# Patient Record
Sex: Male | Born: 1960 | Hispanic: No | Marital: Married | Smoking: Former smoker
Health system: Southern US, Community
[De-identification: ages and names within clinical notes are randomized; demographics above are authoritative.]

## PROBLEM LIST (undated history)

## (undated) DIAGNOSIS — T7840XA Allergy, unspecified, initial encounter: Secondary | ICD-10-CM

## (undated) DIAGNOSIS — Z860100 Personal history of colon polyps, unspecified: Secondary | ICD-10-CM

## (undated) DIAGNOSIS — I82409 Acute embolism and thrombosis of unspecified deep veins of unspecified lower extremity: Secondary | ICD-10-CM

## (undated) DIAGNOSIS — R7303 Prediabetes: Secondary | ICD-10-CM

## (undated) DIAGNOSIS — C159 Malignant neoplasm of esophagus, unspecified: Secondary | ICD-10-CM

## (undated) DIAGNOSIS — D649 Anemia, unspecified: Secondary | ICD-10-CM

## (undated) DIAGNOSIS — Z8719 Personal history of other diseases of the digestive system: Secondary | ICD-10-CM

## (undated) DIAGNOSIS — D179 Benign lipomatous neoplasm, unspecified: Secondary | ICD-10-CM

## (undated) DIAGNOSIS — M1990 Unspecified osteoarthritis, unspecified site: Secondary | ICD-10-CM

## (undated) HISTORY — PX: PORTA CATH REMOVAL: CATH118286

## (undated) HISTORY — DX: Anemia, unspecified: D64.9

## (undated) HISTORY — PX: UPPER GASTROINTESTINAL ENDOSCOPY: SHX188

## (undated) HISTORY — DX: Allergy, unspecified, initial encounter: T78.40XA

## (undated) HISTORY — DX: Acute embolism and thrombosis of unspecified deep veins of unspecified lower extremity: I82.409

## (undated) HISTORY — DX: Personal history of colon polyps, unspecified: Z86.0100

## (undated) HISTORY — DX: Benign lipomatous neoplasm, unspecified: D17.9

## (undated) HISTORY — PX: COLONOSCOPY: SHX174

## (undated) HISTORY — PX: TONSILLECTOMY: SUR1361

## (undated) HISTORY — DX: Personal history of other diseases of the digestive system: Z87.19

## (undated) HISTORY — PX: POLYPECTOMY: SHX149

## (undated) HISTORY — DX: Personal history of colonic polyps: Z86.010

---

## 2006-09-09 ENCOUNTER — Encounter: Payer: Self-pay | Admitting: Internal Medicine

## 2009-06-19 ENCOUNTER — Encounter: Payer: Self-pay | Admitting: Internal Medicine

## 2009-06-19 LAB — CONVERTED CEMR LAB
Albumin: 4.7 g/dL
Alkaline Phosphatase: 63 units/L
BUN: 13 mg/dL
Creatinine, Ser: 0.9 mg/dL
HDL: 67 mg/dL
Sodium: 137 meq/L
TSH: 0.46 microintl units/mL
Total Bilirubin: 0.7 mg/dL
Total Protein: 7.1 g/dL
Triglyceride fasting, serum: 39 mg/dL

## 2010-05-18 ENCOUNTER — Encounter: Payer: Self-pay | Admitting: Internal Medicine

## 2010-06-25 ENCOUNTER — Ambulatory Visit: Payer: Self-pay | Admitting: Internal Medicine

## 2010-06-25 ENCOUNTER — Encounter: Payer: Self-pay | Admitting: Internal Medicine

## 2010-06-25 DIAGNOSIS — R1012 Left upper quadrant pain: Secondary | ICD-10-CM | POA: Insufficient documentation

## 2010-06-25 DIAGNOSIS — I959 Hypotension, unspecified: Secondary | ICD-10-CM

## 2010-06-25 DIAGNOSIS — J45909 Unspecified asthma, uncomplicated: Secondary | ICD-10-CM | POA: Insufficient documentation

## 2010-06-25 DIAGNOSIS — Z8719 Personal history of other diseases of the digestive system: Secondary | ICD-10-CM

## 2010-06-25 DIAGNOSIS — R079 Chest pain, unspecified: Secondary | ICD-10-CM | POA: Insufficient documentation

## 2010-06-25 DIAGNOSIS — Z860100 Personal history of colon polyps, unspecified: Secondary | ICD-10-CM | POA: Insufficient documentation

## 2010-06-25 DIAGNOSIS — J309 Allergic rhinitis, unspecified: Secondary | ICD-10-CM | POA: Insufficient documentation

## 2010-06-25 HISTORY — DX: Chest pain, unspecified: R07.9

## 2010-06-25 HISTORY — DX: Left upper quadrant pain: R10.12

## 2010-06-26 LAB — CONVERTED CEMR LAB
AST: 22 units/L (ref 0–37)
Alkaline Phosphatase: 80 units/L (ref 39–117)
BUN: 13 mg/dL (ref 6–23)
Basophils Absolute: 0 10*3/uL (ref 0.0–0.1)
Bilirubin, Direct: 0.1 mg/dL (ref 0.0–0.3)
Calcium: 9 mg/dL (ref 8.4–10.5)
Creatinine, Ser: 0.8 mg/dL (ref 0.4–1.5)
GFR calc non Af Amer: 103.6 mL/min (ref >60.00)
Glucose, Bld: 97 mg/dL (ref 70–99)
Ketones, ur: NEGATIVE mg/dL
Leukocytes, UA: NEGATIVE
Lymphocytes Relative: 20.1 % (ref 12.0–46.0)
Lymphs Abs: 1.1 10*3/uL (ref 0.7–4.0)
Monocytes Relative: 10.2 % (ref 3.0–12.0)
Neutrophils Relative %: 67.7 % (ref 43.0–77.0)
Nitrite: NEGATIVE
Platelets: 226 10*3/uL (ref 150.0–400.0)
RDW: 14 % (ref 11.5–14.6)
Specific Gravity, Urine: 1.02 (ref 1.000–1.030)
TSH: 0.72 microintl units/mL (ref 0.35–5.50)
Total Bilirubin: 0.8 mg/dL (ref 0.3–1.2)
Total Protein, Urine: NEGATIVE mg/dL
WBC: 5.6 10*3/uL (ref 4.5–10.5)
pH: 6.5 (ref 5.0–8.0)

## 2010-07-06 ENCOUNTER — Telehealth (INDEPENDENT_AMBULATORY_CARE_PROVIDER_SITE_OTHER): Payer: Self-pay | Admitting: *Deleted

## 2010-07-08 HISTORY — PX: ESOPHAGOGASTRECTOMY: SHX1528

## 2010-07-10 ENCOUNTER — Encounter: Payer: Self-pay | Admitting: Internal Medicine

## 2010-07-23 ENCOUNTER — Ambulatory Visit
Admission: RE | Admit: 2010-07-23 | Discharge: 2010-07-23 | Payer: Self-pay | Source: Home / Self Care | Attending: Internal Medicine | Admitting: Internal Medicine

## 2010-07-23 ENCOUNTER — Encounter (INDEPENDENT_AMBULATORY_CARE_PROVIDER_SITE_OTHER): Payer: Self-pay | Admitting: *Deleted

## 2010-07-23 DIAGNOSIS — R141 Gas pain: Secondary | ICD-10-CM

## 2010-07-23 DIAGNOSIS — R143 Flatulence: Secondary | ICD-10-CM

## 2010-07-23 DIAGNOSIS — K219 Gastro-esophageal reflux disease without esophagitis: Secondary | ICD-10-CM | POA: Insufficient documentation

## 2010-07-23 DIAGNOSIS — R142 Eructation: Secondary | ICD-10-CM

## 2010-07-23 HISTORY — DX: Gas pain: R14.1

## 2010-08-09 NOTE — Procedures (Signed)
Summary: Ledon Snare MD  Colon/James Cora Collum MD   Imported By: Lester Palmer 07/13/2010 10:39:07  _____________________________________________________________________  External Attachment:    Type:   Image     Comment:   External Document

## 2010-08-09 NOTE — Letter (Addendum)
Summary: Primary Care Consult Scheduled Letter  [facility]  [address] [place], [place] [address]   Phone: [phone]  Fax: [phone]      07/23/2010 MRN: [id number]  [facility] [address] [place], [place]  [address]    Dear Mr. ITALIANO,      We have scheduled an appointment for you. At the recommendation of Dr.Lescber, we have scheduled you a consult with Dr.JacobsCorinda [facility]) on Feb 17,2012 Fri at 3:00 pm .Their address is [address] . The office phone number is [phone].If this appointment day and time is not convenient for you, please feel free to call the office of the doctor you are being referred to at the number listed above and reschedule the appointment.    It is important for you to keep your scheduled appointments. We are here to make sure you are given good patient care. If you have questions or you have made changes to your appointment, please notify us at  [phone]        , ask for    debra          .   Thank you,  Patient Care Coordinator [facility]

## 2010-08-09 NOTE — Progress Notes (Signed)
  Phone Note Other Incoming   Request: Send information Summary of Call: Records received from [facility] at [facility]. 12 pages forwarded to Dr. Felicity Coyer for review.

## 2010-08-09 NOTE — Letter (Signed)
Summary: Deboraha Sprang at [facility]   Imported By: Lester [place] 07/13/2010 10:37:42  _____________________________________________________________________  External Attachment:    Type:   Image     Comment:   External Document

## 2010-08-09 NOTE — Assessment & Plan Note (Signed)
Summary: 4 WK FU  STC   Vital Signs:  Patient profile:   50 year old male Height:      74 inches (187.96 cm) Weight:      214.6 pounds (97.55 kg) O2 Sat:      95 % on Room air Temp:     97.8 degrees F (36.56 degrees C) oral Pulse rate:   691 / minute BP sitting:   102 / 68  (left arm) Cuff size:   large  Vitals Entered By: Orlan Leavens RMA (July 23, 2010 9:05 AM)  O2 Flow:  Room air CC: 4 week follow-up Is Patient Diabetic? No Pain Assessment Patient in pain? no        Primary Care Provider:  Newt Lukes MD  CC:  4 week follow-up.  History of Present Illness:  f/u abd pain - located LUQ onset Nov 2011- has improved with PPI once daily use since 06/2010 prev radiated to L chest and substernum region now pain improved, only mild stab if any pain - but persisitng gas/belching no sticking with swallow or regurg no n/v or change in bowels - no lower abd pain, no back or shoulder blade pain symptoms  no BRBPR or melena no fever or recent travel - continue fatigue since onset of symptoms +weight loss 5# in 4 weeks due to gas and sour belching no cough, SOB or HA - no vision changes or swelling  Preventive Screening-Counseling & Management  Alcohol-Tobacco     Alcohol drinks/day: 0     Alcohol Counseling: not indicated; patient does not drink     Smoking Status: quit     Packs/Day: 1.0     Year Quit: 1999  Caffeine-Diet-Exercise     Does Patient Exercise: yes     Depression Counseling: not indicated; screening negative for depression  Clinical Review Panels:  Lipid Management   Cholesterol:  151 (06/19/2009)   LDL (bad choesterol):  75 (06/19/2009)   HDL (good cholesterol):  67 (06/19/2009)   Triglycerides:  39 (06/19/2009)  CBC   WBC:  5.6 (06/25/2010)   RBC:  4.32 (06/25/2010)   Hgb:  13.3 (06/25/2010)   Hct:  38.6 (06/25/2010)   Platelets:  226.0 (06/25/2010)   MCV  89.2 (06/25/2010)   MCHC  34.6 (06/25/2010)   RDW  14.0 (06/25/2010)  PMN:  67.7 (06/25/2010)   Lymphs:  20.1 (06/25/2010)   Monos:  10.2 (06/25/2010)   Eosinophils:  1.4 (06/25/2010)   Basophil:  0.6 (06/25/2010)  Complete Metabolic Panel   Glucose:  97 (06/25/2010)   Sodium:  139 (06/25/2010)   Potassium:  4.1 (06/25/2010)   Chloride:  104 (06/25/2010)   CO2:  27 (06/25/2010)   BUN:  13 (06/25/2010)   Creatinine:  0.8 (06/25/2010)   Albumin:  3.9 (06/25/2010)   Total Protein:  6.6 (06/25/2010)   Calcium:  9.0 (06/25/2010)   Total Bili:  0.8 (06/25/2010)   Alk Phos:  80 (06/25/2010)   SGPT (ALT):  15 (06/25/2010)   SGOT (AST):  22 (06/25/2010)   Current Medications (verified): 1)  Proventil Hfa 108 (90 Base) Mcg/act Aers (Albuterol Sulfate) .... Use As Needed 2)  Prilosec Otc 20 Mg Tbec (Omeprazole Magnesium) .... 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    Physical Exam  General:  thin, nontoxic - alert, well-developed, well-nourished, and cooperative to examination.   wife at side Lungs:  normal respiratory effort, no intercostal retractions or use of accessory muscles; normal breath sounds bilaterally - no crackles and no wheezes.    Heart:  normal rate, regular rhythm, no murmur, and no rub. BLE without edema. Abdomen:  soft, non-tender, normal bowel sounds, no distention; no masses and no  appreciable hepatomegaly or splenomegaly.     Impression & Recommendations:  Problem # 1:  GERD (ICD-530.81)  LUQ pain improved - cont ppi - erx done prior 06/2010 labs reviewed - normal requests eval by gi due to cont belching and gas - done  His updated medication list for this problem includes:    Omeprazole 20 Mg Cpdr (Omeprazole) .[name]... 1 by mouth once daily  Orders: Gastroenterology Referral (GI)  Labs Reviewed: Hgb: 13.3 (06/25/2010)   Hct: 38.6 (06/25/2010)  Problem # 2:  BELCHING (ICD-787.3) try gas x and refer to gi for eval as requested Orders: Gastroenterology Referral (GI)  Complete Medication List: 1)  Proventil Hfa 108 (90 Base) Mcg/act Aers (Albuterol sulfate) .... Use as needed 2)  Omeprazole 20 Mg Cpdr (Omeprazole) .[name].. 1 by mouth once daily  Patient Instructions: 1)  it was good to see you today. 2)  continue omeprazole 20mg  daily for now - your prescription has been electronically submitted to your pharmacy. Please take as directed. Contact our office if you believe you're having problems with the medication(s).  3)  also use GasX for bloating/belching/gas symptoms - take as directed on box - ok to use with prilosec as needed  4)  we'll make referral to [facility] for evaluation. Our office will contact you regarding this appointment once made.  5)  Please schedule a follow-up appointment in 4-6 months to review, call sooner if problems.  Prescriptions: OMEPRAZOLE 20 MG CPDR (OMEPRAZOLE) 1 by mouth once daily  #30 x 6   Entered and Authorized by:   Newt Lukes MD   Signed by:   Newt Lukes MD on 07/23/2010   Method used:   Electronically to        [facility] # [phone]* (retail)       [address]       [place], [place]  [address]       Ph: [phone]       Fax: [phone]   RxID:   [id number]    Orders Added: 1)  Est. Patient Level IV [47425] 2)  Gastroenterology Referral [GI]

## 2010-08-09 NOTE — Letter (Signed)
SummaryDeboraha Sprang at [facility] at [facility]   Imported By: Lester [place] 07/13/2010 10:40:05  _____________________________________________________________________  External Attachment:    Type:   Image     Comment:   External Document

## 2010-08-09 NOTE — Assessment & Plan Note (Signed)
Summary: NEW/BCBS/#/CD   Vital Signs:  Alfred Delgado:   50 year old male Height:      74 inches (187.96 cm) Weight:      219.0 pounds (99.55 kg) BMI:     28.22 O2 Sat:      96 % on Room air Temp:     98.6 degrees F (37.00 degrees C) oral Pulse rate:   87 / minute BP sitting:   98 / 68  (left arm) Cuff size:   large  Vitals Entered By: Alfred Delgado RMA (June 25, 2010 9:34 AM)  O2 Flow:  Room air CC: [name] Is Alfred Diabetic? No Pain Assessment Alfred in pain? no        Primary Care Alfred Delgado:  Newt Lukes MD  CC:  [name].  History of Present Illness: new pt to me and our practce, here to est care  c/o abd pain - located LUQ onset 4-6 weeks ago radiates to L chest and substernum region intermittent symptoms - occurs almost every day but various intensity - usually mild but 3 episodes intense stabbing pain pain improved with skipping meals - worse with any food no sticking with swallow or regurg no n/v or change in bowels - no lower abd pain, no back or shoulder blade pain symptoms  no BRBPR or melena no fever or recent travel - started PPI otc 4 days ago - ?mild improved pain symptoms  c/o fatigue since onset of symptoms - also "gas" and sour belching no cough, SOB or HA - no vision changes or swelling  Preventive Screening-Counseling & Management  Alcohol-Tobacco     Alcohol drinks/day: 0     Alcohol Counseling: not indicated; Alfred does not drink     Smoking Status: quit     Packs/Day: 1.0     Year Quit: 1999  Caffeine-Diet-Exercise     Does Alfred Exercise: yes     Depression Counseling: not indicated; screening negative for depression  Comments: quit smoking and drinking 1999  Clinical Review Panels:  Prevention   Last Colonoscopy:  Location:  [facility].   Results: Normal.  Pt states previous colon dx with diverticulosis, 2 polyp remove. Next colon due March 2013 (07/08/2006)   Current Medications  (verified): 1)  Proventil Hfa 108 (90 Base) Mcg/act Aers (Albuterol Sulfate) .... Use As Needed 2)  Prilosec Otc 20 Mg Tbec (Omeprazole Magnesium) .... 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  The Alfred denies fever, weight loss, vision loss, hoarseness, syncope, dyspnea on exertion, peripheral edema, headaches, melena, hematochezia, severe indigestion/heartburn, hematuria, incontinence, genital sores, muscle weakness, suspicious skin lesions, transient blindness, difficulty walking, depression, abnormal bleeding, enlarged lymph nodes, and angioedema.         otherwise, see HPI above. I have reviewed all other systems and they were negative.   Physical Exam  General:  thin, nontoxic - alert, well-developed,  well-nourished, and cooperative to examination.   wife at side Head:  Normocephalic and atraumatic without obvious abnormalities. No apparent alopecia or balding. Eyes:  vision grossly intact; pupils equal, round and reactive to light.  conjunctiva and lids normal.    Ears:  normal pinnae bilaterally, without erythema, swelling, or tenderness to palpation. TMs clear, without effusion, or cerumen impaction. Hearing grossly normal bilaterally  Mouth:  teeth and gums in good repair; mucous membranes moist, without lesions or ulcers. oropharynx clear without exudate, no erythema.  Neck:   prominant cricoid- supple, full ROM, no masses, no thyromegaly; no thyroid nodules or tenderness. no JVD or carotid bruits.   Chest Wall:  No deformities, masses, tenderness or gynecomastia noted. Lungs:  normal respiratory effort, no intercostal retractions or use of accessory muscles; normal breath sounds bilaterally - no crackles and no wheezes.    Heart:  normal rate, regular rhythm, no murmur, and no rub. BLE without edema. normal DP pulses and normal cap refill in all 4 extremities    Abdomen:  soft, non-tender, normal bowel sounds, no distention; no masses and no appreciable hepatomegaly or splenomegaly.   Msk:  No deformity or scoliosis noted of thoracic or lumbar spine.   Neurologic:  alert & oriented X3 and cranial nerves II-XII symetrically intact.  strength normal in all extremities, sensation intact to light touch, and gait normal. speech fluent without dysarthria or aphasia; follows commands with good comprehension.  Skin:  subcut "cysts" along anterior chest and abd wall - nontender and not inflammed - chronic per pt - otherwise,  no rashes, vesicles, ulcers, or erythema. No nodules or irregularity to palpation.  Psych:  Oriented X3, memory intact for recent and remote, normally interactive, good eye contact, not anxious appearing, not depressed appearing, and not agitated.      Impression & Recommendations:  Problem # 1:  LUQ PAIN (ICD-789.02) look for metabolic or GI abn on labs - EKG for radiation to chest -- no ischemia or other abn  labs - cont PPI, consider inc dose if no lab abn - also ?need for CT but doubt related to diverticulitis (no pain on exam, not reporducible) also suggested gas x or probiotics for bloating and gas symptoms if labs ok Orders: TLB-BMP (Basic Metabolic Panel-BMET) (80048-METABOL) TLB-CBC Platelet - w/Differential (85025-CBCD) TLB-Hepatic/Liver Function Pnl (80076-HEPATIC) TLB-TSH (Thyroid Stimulating Hormone) (84443-TSH) TLB-Udip w/ Micro  (81001-URINE) EKG w/ Interpretation (93000)  Problem # 2:  CHEST PAIN (ICD-786.50) ?esophageal or other GI abn - see LUQ pain above - EKG today - no cardiac abn - O2 normal, nontoxic on exam Orders: TLB-BMP (Basic Metabolic Panel-BMET) (80048-METABOL) TLB-CBC Platelet - w/Differential (85025-CBCD) TLB-Hepatic/Liver Function Pnl (80076-HEPATIC) TLB-TSH (Thyroid Stimulating Hormone) (84443-TSH) TLB-Udip w/ Micro (81001-URINE) EKG w/ Interpretation (93000)  Problem # 3:  UNSPECIFIED HYPOTENSION (ICD-458.9) labs as above - pt not dizzy or presyncopal -  encouraged by mouth intake - hydration - send for prior records as pt "always low BP but not usually this low" Orders: TLB-BMP (Basic Metabolic Panel-BMET) (80048-METABOL) TLB-CBC Platelet - w/Differential (85025-CBCD) TLB-Hepatic/Liver Function Pnl (80076-HEPATIC) TLB-TSH (Thyroid Stimulating Hormone) (84443-TSH) TLB-Udip w/ Micro (81001-URINE) EKG w/ Interpretation (93000)  Complete Medication List: 1)  Proventil Hfa 108 (90 Base) Mcg/act Aers (Albuterol sulfate) .... Use as needed 2)  Prilosec Otc 20 Mg Tbec (Omeprazole magnesium) .... Take 1 by mouth once daily  Alfred Instructions: 1)  it was good to see you today. 2)  exam and EKG today look good - no heart abnormality  seen 3)  test(s) ordered today - your results will be posted on the phone tree for review in 48-72 hours from the time of test completion; call [phone] and enter your 9 digit MRN (listed above on this page, just below your name); if any changes need to be made or there are abnormal results, you will be contacted directly.  4)  continue Prilosec 20mg  daily for now - if dose needs to be changed, you will be notified after review of your labs 5)  also use GasX for bloating/belching/gas symptoms - take as directed on box - ok to use with prilosec as needed  6)  will send for records from [place] to review 7)  Please schedule a follow-up appointment in 4 weeks to  continue review of gas and pain, call sooner if problems.  8)  Drink as much fluid as you can tolerate for the next few days.   Orders Added: 1)  TLB-BMP (Basic Metabolic Panel-BMET) [80048-METABOL] 2)  TLB-CBC Platelet - w/Differential [85025-CBCD] 3)  TLB-Hepatic/Liver Function Pnl [80076-HEPATIC] 4)  TLB-TSH (Thyroid Stimulating Hormone) [84443-TSH] 5)  TLB-Udip w/ Micro [81001-URINE] 6)  EKG w/ Interpretation [93000] 7)  [name] Level IV [84132]     Colonoscopy  Procedure date:  07/08/2006  Findings:      Location:  [facility].   Results: Normal.  Pt states previous colon dx with diverticulosis, 2 polyp remove. Next colon due March 2013

## 2010-08-24 ENCOUNTER — Encounter: Payer: Self-pay | Admitting: Gastroenterology

## 2010-08-24 ENCOUNTER — Encounter (INDEPENDENT_AMBULATORY_CARE_PROVIDER_SITE_OTHER): Payer: Self-pay | Admitting: *Deleted

## 2010-08-24 ENCOUNTER — Ambulatory Visit (INDEPENDENT_AMBULATORY_CARE_PROVIDER_SITE_OTHER): Payer: Managed Care, Other (non HMO) | Admitting: Gastroenterology

## 2010-08-24 DIAGNOSIS — R12 Heartburn: Secondary | ICD-10-CM

## 2010-08-24 DIAGNOSIS — R1310 Dysphagia, unspecified: Secondary | ICD-10-CM

## 2010-08-29 ENCOUNTER — Ambulatory Visit (HOSPITAL_COMMUNITY)
Admission: RE | Admit: 2010-08-29 | Discharge: 2010-08-29 | Disposition: A | Discharge status: Home / Self Care | Payer: Managed Care, Other (non HMO) | Source: Ambulatory Visit | Attending: Gastroenterology | Admitting: Gastroenterology

## 2010-08-29 ENCOUNTER — Encounter: Payer: Managed Care, Other (non HMO) | Admitting: Gastroenterology

## 2010-08-29 ENCOUNTER — Other Ambulatory Visit: Payer: Self-pay | Admitting: Gastroenterology

## 2010-08-29 DIAGNOSIS — R634 Abnormal weight loss: Secondary | ICD-10-CM | POA: Insufficient documentation

## 2010-08-29 DIAGNOSIS — C160 Malignant neoplasm of cardia: Secondary | ICD-10-CM

## 2010-08-29 DIAGNOSIS — R1310 Dysphagia, unspecified: Secondary | ICD-10-CM | POA: Insufficient documentation

## 2010-08-29 DIAGNOSIS — Z800 Family history of malignant neoplasm of digestive organs: Secondary | ICD-10-CM | POA: Insufficient documentation

## 2010-08-29 NOTE — Letter (Signed)
Summary: EGD Instructions  [facility]  [address] [place], [place] [address]   Phone: [phone]  Fax: [phone]       SASCHA BAUGHER    1961-04-03    MRN: [id number]       Procedure Day /Date:08/29/10 WED     Arrival Time: 730 am     Procedure Time:830 am     Location of Procedure:                     [facility] ( Outpatient Registration)    PREPARATION FOR ENDOSCOPY   On 08/29/10  THE DAY OF THE PROCEDURE:  1.   No solid foods, milk or milk products are allowed after midnight the night before your procedure.  2.   Do not drink anything colored red or purple.  Avoid juices with pulp.  No orange juice.  3.  You may drink clear liquids until 430 am , which is 4 hours before your procedure.                                                                                                CLEAR LIQUIDS INCLUDE: Water Jello Ice Popsicles Tea (sugar ok, no milk/cream) Powdered fruit flavored drinks Coffee (sugar ok, no milk/cream) Gatorade Juice: apple, white grape, white cranberry  Lemonade Clear bullion, consomm, broth Carbonated beverages (any kind) Strained chicken noodle soup Hard Candy   MEDICATION INSTRUCTIONS  Unless otherwise instructed, you should take regular prescription medications with a small sip of water as early as possible the morning of your procedure.             OTHER INSTRUCTIONS  You will need a responsible adult at least 49 years of age to accompany you and drive you home.   This person must remain in the waiting room during your procedure.  Wear loose fitting clothing that is easily removed.  Leave jewelry and other valuables at home.  However, you may wish to bring a book to read or an iPod/MP3 player to listen to music as you wait for your procedure to start.  Remove all body piercing jewelry and leave at home.  Total time from sign-in until discharge is approximately 2-3 hours.  You should go home  directly after your procedure and rest.  You can resume normal activities the day after your procedure.  The day of your procedure you should not:   Drive   Make legal decisions   Operate machinery   Drink alcohol   Return to work  You will receive specific instructions about eating, activities and medications before you leave.    The above instructions have been reviewed and explained to me by   _______________________    I fully understand and can verbalize these instructions _____________________________ Date _________

## 2010-08-29 NOTE — Assessment & Plan Note (Signed)
   History of Present Illness Visit Type: Initial Consult Primary GI MD: Rob Bunting MD Primary Provider: Newt Lukes MD Requesting Provider: Rene Paci, MD Chief Complaint: GERD History of Present Illness:        very pleasant 50 year old man who ws having LUQ pains, can have foamy.  Has been having mildly progressive solid food dyphagia for the past few months.  Not improved with ppi.  has been losing weight, relatively unintentialy.    his father had esophageal cancer  The luq pain is crampy.    sore lower jaw.  he started otc prilosec,            Current Medications (verified): 1)  Proventil Hfa 108 (90 Base) Mcg/act Aers (Albuterol Sulfate) .... Use As Needed 2)  Omeprazole 20 Mg Cpdr (Omeprazole) .[name].. 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  All other review of systems was otherwise negative.   Vital Signs:  Patient profile:   50 year old male Height:      74 inches Weight:      212.13 pounds BMI:     27.33 Pulse rate:   76 / minute Pulse rhythm:   regular BP sitting:   106 / 66  (left arm) Cuff size:   regular  Vitals Entered By: June McMurray CMA Duncan Dull) (August 24, 2010 3:00 PM)  Physical Exam  Additional Exam:  Constitutional: generally well appearing Psychiatric: alert and oriented  times 3 Eyes: extraocular movements intact Mouth: oropharynx moist, no lesions Neck: supple, no lymphadenopathy Cardiovascular: heart regular rate and rythm Lungs: CTA bilaterally Abdomen: soft, non-tender, non-distended, no obvious ascites, no peritoneal signs, normal bowel sounds Extremities: no lower extremity edema bilaterally Skin: no lesions on visible extremities    Impression & Recommendations:  Problem # 1:   left upper quadrant discomfort, GERD related. mildly progressive solid food dysphagia  I am concerned about the possibility of neoplasm. he has lost some weight , rather unintentionally. he has slightly progressive dysphasia. We will proceed with EGD next week. He will stay on proton pump inhibitor once daily for now.  Patient Instructions: 1)  You will be scheduled to have an upper endoscopy nex week at [facility]. 2)  GERD handout given. 3)  A copy of this information will be sent to Dr. Nolberto Hanlon. 4)  The medication list was reviewed and reconciled.  All changed / newly prescribed medications were explained.  A complete medication list was provided to the patient / caregiver.  Appended Document: Orders Update/EGD    Clinical Lists Changes  Orders: Added new Test order of ZEGD (ZEGD) - Signed

## 2010-08-30 ENCOUNTER — Other Ambulatory Visit: Payer: Self-pay | Admitting: Gastroenterology

## 2010-08-30 DIAGNOSIS — Z8501 Personal history of malignant neoplasm of esophagus: Secondary | ICD-10-CM | POA: Insufficient documentation

## 2010-08-30 DIAGNOSIS — C169 Malignant neoplasm of stomach, unspecified: Secondary | ICD-10-CM

## 2010-08-30 DIAGNOSIS — C159 Malignant neoplasm of esophagus, unspecified: Secondary | ICD-10-CM

## 2010-08-30 HISTORY — DX: Malignant neoplasm of esophagus, unspecified: C15.9

## 2010-09-03 ENCOUNTER — Other Ambulatory Visit: Payer: Managed Care, Other (non HMO)

## 2010-09-03 ENCOUNTER — Ambulatory Visit (INDEPENDENT_AMBULATORY_CARE_PROVIDER_SITE_OTHER)
Admission: RE | Admit: 2010-09-03 | Discharge: 2010-09-03 | Disposition: A | Discharge status: Home / Self Care | Payer: Managed Care, Other (non HMO) | Source: Ambulatory Visit | Attending: Gastroenterology | Admitting: Gastroenterology

## 2010-09-03 DIAGNOSIS — C169 Malignant neoplasm of stomach, unspecified: Secondary | ICD-10-CM

## 2010-09-03 MED ORDER — IOHEXOL 300 MG/ML  SOLN
100.0000 mL | Freq: Once | INTRAMUSCULAR | Status: AC | PRN
  Administered 2010-09-03: 100 mL via INTRAVENOUS

## 2010-09-04 NOTE — Procedures (Signed)
Summary: Upper Endoscopy  Patient: [name] Note: All result statuses are Final unless otherwise noted.  Tests: (1) Upper Endoscopy (EGD)   EGD Upper Endoscopy       DONE     [facility]     [address] [place], [place]  [address]           ENDOSCOPY PROCEDURE REPORT           PATIENT:  Alfred Delgado, Alfred Delgado  MR#:  [id number]     BIRTHDATE:  04/26/1961, 49 yrs. old  GENDER:  male     ENDOSCOPIST:  Rachael Fee, MD     Referred by:  Rene Paci, M.D.     PROCEDURE DATE:  08/29/2010     PROCEDURE:  EGD with biopsy, 43239     ASA CLASS:  Class II     INDICATIONS:  dysphagia, weight loss, father with esophageal     cancer     MEDICATIONS:  Fentanyl 75 mcg IV, Versed 4 mg IV     TOPICAL ANESTHETIC:  Cetacaine Spray           DESCRIPTION OF PROCEDURE:   After the risks benefits and     alternatives of the procedure were thoroughly explained, informed     consent was obtained.  The Pentax Gastroscope [id number] endoscope     was introduced through the mouth and advanced to the second     portion of the duodenum, without limitations.  The instrument was     slowly withdrawn as the mucosa was fully examined.     <<PROCEDUREIMAGES>>     There was a friable, fungating, nearly circumferential mass at GE     junction. The proximal edge of this malignant appearing mass was     at 38cm from incisors and distal edge was at 42cm. GE junction     landmark was obliterated but probably at about 41cm. Several     biopsies were taken (see image1 and image3).  Otherwise the     examination was normal (see image4 and image6).    Retroflexed     views revealed no abnormalities.    The scope was then withdrawn     from the patient and the procedure completed.     COMPLICATIONS:  None           ENDOSCOPIC IMPRESSION:     1) Clearly malignant mass at GE junction (from 42cm to 38cm,     nearly circumferential. GE junction at 41cm). This was biopsied     2) Otherwise normal  examination           RECOMMENDATIONS:     My office will arrange staging workup (CT scan and bloodwork) as     well as referral to Dr. Dewayne Shorter, Dr. Mancel Bale.  If CT     shows no clear metastatic disease, then will proceed with EUS.           ______________________________     Rachael Fee, MD           n.     eSIGNED:   Rachael Fee at 08/29/2010 09:01 AM           Daphine Deutscher, [id number]  Note: An exclamation mark (!) indicates a result that was not dispersed into the flowsheet. Document Creation Date: 08/29/2010 9:02 AM _______________________________________________________________________  (1) Order result status: Final Collection or observation date-time: 08/29/2010 08:53 Requested date-time:  Receipt date-time:  Reported date-time:  Referring Physician:   Ordering Physician: Rob Bunting [phone]) Specimen Source:  Source: Launa Grill Order Number: [id number] Lab site:   Appended Document: Upper Endoscopy patty,  he needs referral to Dr. Edwyna Shell, Dr. Mancel Bale for newly diagnosed GE junction cancer...staging in the works.  He also needs CT scan chest, abd, pelvis with IV and oral contrast.   Appended Document: Orders Update/Dr Edwyna Shell, Dr Truett Perna, CT PT AWARE AND WILL PICK UP CONTRAST AT FRONT DESK   Clinical Lists Changes  Problems: Added new problem of NEOPLASM, MALIGNANT, ESOPHAGUS (ICD-150.9) Orders: Added new Test order of [facility]) - Signed Added new Test order of Misc. Referral (Misc. Ref) - Signed Added new Referral order of CT Chest/Abdomen w IV and Oral Contrast (CT CH/ABD w IV/Oral ) - Signed Added new Test order of T-CT Pelvis w/contrast (98119) - Signed

## 2010-09-04 NOTE — Procedures (Signed)
Summary: ENDO prep/[facility]  ENDO prep/[facility]   Imported By: Lester Polk 08/28/2010 10:09:55  _____________________________________________________________________  External Attachment:    Type:   Image     Comment:   External Document

## 2010-09-05 ENCOUNTER — Encounter: Payer: Self-pay | Admitting: Internal Medicine

## 2010-09-05 ENCOUNTER — Telehealth (INDEPENDENT_AMBULATORY_CARE_PROVIDER_SITE_OTHER): Payer: Self-pay | Admitting: *Deleted

## 2010-09-05 ENCOUNTER — Other Ambulatory Visit: Payer: Self-pay | Admitting: Thoracic Surgery

## 2010-09-05 ENCOUNTER — Encounter (INDEPENDENT_AMBULATORY_CARE_PROVIDER_SITE_OTHER): Payer: Self-pay | Admitting: *Deleted

## 2010-09-05 ENCOUNTER — Encounter (INDEPENDENT_AMBULATORY_CARE_PROVIDER_SITE_OTHER): Payer: Managed Care, Other (non HMO) | Admitting: Thoracic Surgery

## 2010-09-05 DIAGNOSIS — C160 Malignant neoplasm of cardia: Secondary | ICD-10-CM

## 2010-09-05 DIAGNOSIS — C159 Malignant neoplasm of esophagus, unspecified: Secondary | ICD-10-CM

## 2010-09-06 NOTE — Letter (Signed)
September 05, 2010  Rachael Fee, MD [address] [place], [place] [address]  Re:  Alfred Delgado, Alfred Delgado                 DOB:  1961-01-13  Dear Jesusita Oka;  This 50 year old patient has an interesting family history that his father had esophageal cancer and he came in with complaint of left upper quadrant pain and mild dysphagia.  He described his swallowing as kind of occasionally foamy.  He has had weight loss and crampy left upper quadrant pain.  He underwent a CT scan that showed a distal esophageal mass and an endoscopy by Dr. Christella Hartigan revealed esophageal cancer from 38- 42 cm and a biopsy at 41 cm was positive for cancer.  He has had no carcinoma arising in high-grade glandular dysplasia.  He underwent a CT. He was scheduled for ultrasound evaluation.  There were 2 nodes seen on the CT scan that were 12 mm in size and several small nodes in the celiac axis.  He has had no melena or hematemesis, but has had some mild GERD and mild dysphagia.  He is referred here.  He is scheduled to see Dr. Truett Perna in 2 days.  PAST MEDICAL HISTORY:  He takes Prilosec 20 mg a day, albuterol, and Claritin.  He has got allergies and asthma.  FAMILY HISTORY:  As mentioned, his father had thyroid cancer.  His mother had breast cancer and thyroid disease.  His sister with thyroid disease.  SOCIAL HISTORY:  He is married.  He has no children.  He works as a Curator for [employer].  He quit smoking in 1999.  He does not drink alcohol on a regular basis.  REVIEW OF SYSTEMS:  VITAL SIGNS:  He is 210 pounds.  He is 6 feet 2 inches. CARDIAC:  No angina or atrial fibrillation. PULMONARY:  No hemoptysis. GI:  See history of present illness. GU:  No kidney disease, dysuria, or frequent urination. VASCULAR:  No claudication, DVT, or TIAs. NEUROLOGICAL:  No dizziness, headaches, blackouts, or seizures. MUSCULOSKELETAL:  Arthritis. PSYCHIATRIC:  No depression or nervousness. EYES/ENT:  No changes in eyesight or  hearing. HEMATOLOGICAL:  No problems with bleeding, clotting disorders, or anemia.  PHYSICAL EXAMINATION:  Vital Signs:  His blood pressure is 106/66, pulse 84, respirations 18, and sats are 97%.  Weight is 210 pounds, he is 6 feet 2 inches. Head, Eyes, Ears, Nose, and Throat:  Unremarkable.  Neck:  Supple without thyromegaly.  There is no supraclavicular or axillary adenopathy.  Chest:  Clear to auscultation and percussion.  Heart: Regular sinus rhythm.  No murmur.  Abdomen:  Soft.  There is no hepatosplenomegaly.  Extremities:  Pulses are 2+.  There is no clubbing or edema.  Neurological:  He is oriented x3.  Sensory and motor intact. Cranial nerves intact.  Plan to get a PET scan and a brain scan on him, and then we will discuss his case with Dr. Truett Perna.  I suspect he has at least stage III esophageal cancer.  I would recommend preoperative chemotherapy and radiation followed by surgery if he is not a stage IV.  I have discussed this in great detail with he and his wife, and I will plan to see him back again in 4 weeks.  Ines Bloomer, M.D. Electronically Signed  DPB/MEDQ  D:  09/05/2010  T:  09/06/2010  Job:  [id number]  cc:   Vikki Ports A. Felicity Coyer, MD

## 2010-09-07 ENCOUNTER — Encounter (HOSPITAL_BASED_OUTPATIENT_CLINIC_OR_DEPARTMENT_OTHER): Payer: Managed Care, Other (non HMO) | Admitting: Oncology

## 2010-09-07 DIAGNOSIS — C160 Malignant neoplasm of cardia: Secondary | ICD-10-CM

## 2010-09-07 DIAGNOSIS — Z800 Family history of malignant neoplasm of digestive organs: Secondary | ICD-10-CM

## 2010-09-10 ENCOUNTER — Encounter (HOSPITAL_COMMUNITY): Payer: Self-pay

## 2010-09-10 ENCOUNTER — Encounter (INDEPENDENT_AMBULATORY_CARE_PROVIDER_SITE_OTHER): Payer: Self-pay | Admitting: *Deleted

## 2010-09-10 ENCOUNTER — Encounter (HOSPITAL_COMMUNITY)
Admission: RE | Admit: 2010-09-10 | Discharge: 2010-09-10 | Disposition: A | Discharge status: Home / Self Care | Payer: Managed Care, Other (non HMO) | Source: Ambulatory Visit | Attending: Thoracic Surgery | Admitting: Thoracic Surgery

## 2010-09-10 ENCOUNTER — Ambulatory Visit (HOSPITAL_COMMUNITY)
Admission: RE | Admit: 2010-09-10 | Discharge: 2010-09-10 | Disposition: A | Discharge status: Home / Self Care | Payer: Managed Care, Other (non HMO) | Source: Ambulatory Visit | Attending: Thoracic Surgery | Admitting: Thoracic Surgery

## 2010-09-10 DIAGNOSIS — C159 Malignant neoplasm of esophagus, unspecified: Secondary | ICD-10-CM

## 2010-09-10 DIAGNOSIS — C772 Secondary and unspecified malignant neoplasm of intra-abdominal lymph nodes: Secondary | ICD-10-CM | POA: Insufficient documentation

## 2010-09-10 DIAGNOSIS — I771 Stricture of artery: Secondary | ICD-10-CM | POA: Insufficient documentation

## 2010-09-10 HISTORY — DX: Malignant neoplasm of esophagus, unspecified: C15.9

## 2010-09-10 LAB — GLUCOSE, CAPILLARY: Glucose-Capillary: 107 mg/dL — ABNORMAL HIGH (ref 70–99)

## 2010-09-10 MED ORDER — IOHEXOL 300 MG/ML  SOLN
100.0000 mL | Freq: Once | INTRAMUSCULAR | Status: AC | PRN
  Administered 2010-09-10: 100 mL via INTRAVENOUS

## 2010-09-10 MED ORDER — FLUDEOXYGLUCOSE F - 18 (FDG) INJECTION
19.2000 | Freq: Once | INTRAVENOUS | Status: AC | PRN
  Administered 2010-09-10: 19.2 via INTRAVENOUS

## 2010-09-12 ENCOUNTER — Other Ambulatory Visit: Payer: Self-pay | Admitting: Oncology

## 2010-09-12 ENCOUNTER — Encounter (HOSPITAL_BASED_OUTPATIENT_CLINIC_OR_DEPARTMENT_OTHER): Payer: Managed Care, Other (non HMO) | Admitting: Oncology

## 2010-09-12 DIAGNOSIS — C159 Malignant neoplasm of esophagus, unspecified: Secondary | ICD-10-CM

## 2010-09-12 LAB — COMPREHENSIVE METABOLIC PANEL
ALT: 13 U/L (ref 0–53)
Albumin: 4 g/dL (ref 3.5–5.2)
Alkaline Phosphatase: 77 U/L (ref 39–117)
CO2: 25 mEq/L (ref 19–32)
Glucose, Bld: 104 mg/dL — ABNORMAL HIGH (ref 70–99)
Potassium: 4 mEq/L (ref 3.5–5.3)
Sodium: 137 mEq/L (ref 135–145)
Total Protein: 6.1 g/dL (ref 6.0–8.3)

## 2010-09-12 LAB — CBC WITH DIFFERENTIAL/PLATELET
Eosinophils Absolute: 0.1 10*3/uL (ref 0.0–0.5)
MONO#: 0.7 10*3/uL (ref 0.1–0.9)
NEUT#: 5.6 10*3/uL (ref 1.5–6.5)
RBC: 4.24 10*6/uL (ref 4.20–5.82)
RDW: 13.5 % (ref 11.0–14.6)
WBC: 7.6 10*3/uL (ref 4.0–10.3)

## 2010-09-13 ENCOUNTER — Other Ambulatory Visit: Payer: Self-pay | Admitting: Gastroenterology

## 2010-09-13 ENCOUNTER — Ambulatory Visit (HOSPITAL_COMMUNITY)
Admission: RE | Admit: 2010-09-13 | Discharge: 2010-09-13 | Disposition: A | Discharge status: Home / Self Care | Payer: Managed Care, Other (non HMO) | Source: Ambulatory Visit | Attending: Gastroenterology | Admitting: Gastroenterology

## 2010-09-13 ENCOUNTER — Encounter: Payer: Managed Care, Other (non HMO) | Admitting: Gastroenterology

## 2010-09-13 DIAGNOSIS — C160 Malignant neoplasm of cardia: Secondary | ICD-10-CM | POA: Insufficient documentation

## 2010-09-13 DIAGNOSIS — R933 Abnormal findings on diagnostic imaging of other parts of digestive tract: Secondary | ICD-10-CM

## 2010-09-13 DIAGNOSIS — C772 Secondary and unspecified malignant neoplasm of intra-abdominal lymph nodes: Secondary | ICD-10-CM | POA: Insufficient documentation

## 2010-09-13 DIAGNOSIS — J45909 Unspecified asthma, uncomplicated: Secondary | ICD-10-CM | POA: Insufficient documentation

## 2010-09-13 NOTE — Progress Notes (Signed)
Summary: EUS  Phone Note Outgoing Call Call back at Home Phone [phone] P [facility]     Call placed by: Chales Abrahams CMA Duncan Dull),  September 05, 2010 2:19 PM Summary of Call: pt scheduled for EUS 09/13/10 730 am meds reviewed and instructed. Initial call taken by: Chales Abrahams CMA Duncan Dull),  September 05, 2010 2:19 PM

## 2010-09-13 NOTE — Letter (Signed)
Summary: EGD Instructions  Alfred Delgado  [address] [place], [place] [address]   Phone: [phone]  Fax: [phone]       Alfred Delgado    08/07/1960    MRN: [id number]       Procedure Day /Date:09/13/10 [name]       Arrival Time: 630 am     Procedure Time: 730 am     Location of Procedure:                     [name] ( Outpatient Registration)    PREPARATION FOR ENDOSCOPY   On 09/13/10 THE DAY OF THE PROCEDURE:  1.   No solid foods, milk or milk products are allowed after midnight the night before your procedure.  2.   Do not drink anything colored red or purple.  Avoid juices with pulp.  No orange juice.  3.  You may drink clear liquids until 330 am , which is 4 hours before your procedure.                                                                                                CLEAR LIQUIDS INCLUDE: Water Jello Ice Popsicles Tea (sugar ok, no milk/cream) Powdered fruit flavored drinks Coffee (sugar ok, no milk/cream) Gatorade Juice: apple, white grape, white cranberry  Lemonade Clear bullion, consomm, broth Carbonated beverages (any kind) Strained chicken noodle soup Hard Candy   MEDICATION INSTRUCTIONS  Unless otherwise instructed, you should take regular prescription medications with a small sip of water as early as possible the morning of your procedure.               OTHER INSTRUCTIONS  You will need a responsible adult at least 49 years of age to accompany you and drive you home.   This person must remain in the waiting room during your procedure.  Wear loose fitting clothing that is easily removed.  Leave jewelry and other valuables at home.  However, you may wish to bring a book to read or an iPod/MP3 player to listen to music as you wait for your procedure to start.  Remove all body piercing jewelry and leave at home.  Total time from sign-in until discharge is approximately 2-3 hours.  You should go  home directly after your procedure and rest.  You can resume normal activities the day after your procedure.  The day of your procedure you should not:   Drive   Make legal decisions   Operate machinery   Drink alcohol   Return to work  You will receive specific instructions about eating, activities and medications before you leave.    The above instructions have been reviewed and explained to me by   Chales Abrahams CMA Duncan Dull)  September 05, 2010 2:22 PM     I fully understand and can verbalize these instructions over the phone mailed to home Date 09/05/10

## 2010-09-14 ENCOUNTER — Encounter: Payer: Self-pay | Admitting: Internal Medicine

## 2010-09-14 ENCOUNTER — Ambulatory Visit: Payer: Managed Care, Other (non HMO) | Attending: Radiation Oncology | Admitting: Radiation Oncology

## 2010-09-14 DIAGNOSIS — Z860100 Personal history of colon polyps, unspecified: Secondary | ICD-10-CM | POA: Insufficient documentation

## 2010-09-14 DIAGNOSIS — C772 Secondary and unspecified malignant neoplasm of intra-abdominal lymph nodes: Secondary | ICD-10-CM | POA: Insufficient documentation

## 2010-09-14 DIAGNOSIS — Z87891 Personal history of nicotine dependence: Secondary | ICD-10-CM | POA: Insufficient documentation

## 2010-09-14 DIAGNOSIS — Z800 Family history of malignant neoplasm of digestive organs: Secondary | ICD-10-CM | POA: Insufficient documentation

## 2010-09-14 DIAGNOSIS — Z808 Family history of malignant neoplasm of other organs or systems: Secondary | ICD-10-CM | POA: Insufficient documentation

## 2010-09-14 DIAGNOSIS — J45909 Unspecified asthma, uncomplicated: Secondary | ICD-10-CM | POA: Insufficient documentation

## 2010-09-14 DIAGNOSIS — Z79899 Other long term (current) drug therapy: Secondary | ICD-10-CM | POA: Insufficient documentation

## 2010-09-14 DIAGNOSIS — Z803 Family history of malignant neoplasm of breast: Secondary | ICD-10-CM | POA: Insufficient documentation

## 2010-09-14 DIAGNOSIS — C160 Malignant neoplasm of cardia: Secondary | ICD-10-CM | POA: Insufficient documentation

## 2010-09-14 DIAGNOSIS — Z510 Encounter for antineoplastic radiation therapy: Secondary | ICD-10-CM | POA: Insufficient documentation

## 2010-09-18 NOTE — Procedures (Signed)
Summary: Endoscopic Ultrasound  Patient: Alfred Delgado Note: All result statuses are Final unless otherwise noted.  Tests: (1) Endoscopic Ultrasound (EUS)  EUS Endoscopic Ultrasound                             DONE     [facility]     [address] [place], [place]  [address]          ENDOSCOPIC ULTRASOUND PROCEDURE REPORT          PATIENT:  Alfred Delgado, Alfred Delgado  MR#:  [id number]     BIRTHDATE:  [birth year]  GENDER:  male     ENDOSCOPIST:  Rachael Fee, MD     PROCEDURE DATE:  09/13/2010     PROCEDURE:  Upper EUS w/FNA     ASA CLASS:  Class II     INDICATIONS:  GE junction adenocarcinoma recently diagnosed; PET     scan showed PET avid hepatoduodenal lymphadenopathy     MEDICATIONS:  Fentanyl 100 mcg IV, Versed 10 mg IV, Benadryl 50 mg     IV          DESCRIPTION OF PROCEDURE:   After the risks benefits and     alternatives of the procedure were  explained, informed consent     was obtained. The patient was then placed in the left, lateral,     decubitus postion and IV sedation was administered. Throughout the     procedure, the patient's blood pressure, pulse and oxygen     saturations were monitored continuously.  Under direct     visualization, the  endoscope was introduced through the mouth and     advanced to the stomach antrum.  Water was used as necessary to     provide an acoustic interface.  Upon completion of the imaging,     water was removed and the patient was sent to the recovery room in     satisfactory condition.     <<PROCEDUREIMAGES>>          Endoscopic findings:     1. GE junction mass, clearly malignant, no signficant change since     EGD 2-3 weeks ago.     2. Otherwise normal UGI examination          EUS findings:     1. The GE junction mass corresponded with a heterogeneous,     hypoechoic lesion that was 1.6cm thick and clearly passed into and     through the muscularis propria layer of the esophageal wall (uT3).     2. There were  three very suspicious appearing hepatoduodenal     lymphnodes ranging in size from 3mm to 2cm.  The node that     directly abutted the liver was sampled with 2 passes with a 25     guage BS EUS FNA needle.  Preliminary cytology review was positive     for malignancy, adenocarcinoma.     3. Limited views of liver, pancreas, spleen were all normal.     4. No celiac adenopathy, no mediastinal adenopathy.          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     He has met Drs. Clydene Fake already and is scheduled to meet     radiation oncology (Dr. Mitzi Hansen) tomorrow.          ______________________________  Rachael Fee, MD          cc: Dewayne Shorter, MD; Mancel Bale, MD; Dorothy Puffer, MD          n.     eSIGNED:   Rachael Fee at 09/13/2010 08:37 AM          Daphine Deutscher, [id number]  Note: An exclamation mark (!) indicates a result that was not dispersed into the flowsheet. Document Creation Date: 09/13/2010 8:37 AM _______________________________________________________________________  (1) Order result status: Final Collection or observation date-time: 09/13/2010 08:19 Requested date-time:  Receipt date-time:  Reported date-time:  Referring Physician:   Ordering Physician: Rob Bunting [id number]) Specimen Source:  Source: Launa Grill Order Number: [id number] Lab site:

## 2010-09-18 NOTE — Consult Note (Signed)
Summary: [facility]  [facility]   Imported By: Sherian Rein 09/12/2010 10:52:23  _____________________________________________________________________  External Attachment:    Type:   Image     Comment:   External Document

## 2010-09-20 ENCOUNTER — Ambulatory Visit (HOSPITAL_COMMUNITY)
Admission: RE | Admit: 2010-09-20 | Discharge: 2010-09-20 | Disposition: A | Discharge status: Home / Self Care | Payer: Managed Care, Other (non HMO) | Source: Ambulatory Visit | Attending: Oncology | Admitting: Oncology

## 2010-09-20 ENCOUNTER — Encounter (HOSPITAL_BASED_OUTPATIENT_CLINIC_OR_DEPARTMENT_OTHER): Payer: Managed Care, Other (non HMO) | Admitting: Oncology

## 2010-09-20 DIAGNOSIS — C159 Malignant neoplasm of esophagus, unspecified: Secondary | ICD-10-CM | POA: Insufficient documentation

## 2010-09-20 DIAGNOSIS — C160 Malignant neoplasm of cardia: Secondary | ICD-10-CM

## 2010-09-20 DIAGNOSIS — C155 Malignant neoplasm of lower third of esophagus: Secondary | ICD-10-CM

## 2010-09-20 DIAGNOSIS — M79609 Pain in unspecified limb: Secondary | ICD-10-CM

## 2010-09-20 DIAGNOSIS — Z800 Family history of malignant neoplasm of digestive organs: Secondary | ICD-10-CM

## 2010-09-24 ENCOUNTER — Encounter (HOSPITAL_BASED_OUTPATIENT_CLINIC_OR_DEPARTMENT_OTHER): Payer: Managed Care, Other (non HMO) | Admitting: Oncology

## 2010-09-24 DIAGNOSIS — C160 Malignant neoplasm of cardia: Secondary | ICD-10-CM

## 2010-09-24 DIAGNOSIS — Z5111 Encounter for antineoplastic chemotherapy: Secondary | ICD-10-CM

## 2010-09-25 NOTE — Letter (Signed)
Summary: [facility]  [facility]   Imported By: Lennie Odor 09/20/2010 11:44:49  _____________________________________________________________________  External Attachment:    Type:   Image     Comment:   External Document

## 2010-09-25 NOTE — Letter (Signed)
Summary: Addendum / [facility]  Addendum /  [facility]cer Center   Imported By: Lennie Odor 09/20/2010 11:46:52  _____________________________________________________________________  External Attachment:    Type:   Image     Comment:   External Document

## 2010-09-25 NOTE — Letter (Signed)
Summary: [facility]  [facility]   Imported By: Sherian Rein 09/20/2010 11:31:03  _____________________________________________________________________  External Attachment:    Type:   Image     Comment:   External Document

## 2010-10-01 ENCOUNTER — Encounter (HOSPITAL_BASED_OUTPATIENT_CLINIC_OR_DEPARTMENT_OTHER): Payer: Managed Care, Other (non HMO) | Admitting: Oncology

## 2010-10-01 ENCOUNTER — Other Ambulatory Visit: Payer: Self-pay | Admitting: Oncology

## 2010-10-01 DIAGNOSIS — C155 Malignant neoplasm of lower third of esophagus: Secondary | ICD-10-CM

## 2010-10-01 DIAGNOSIS — Z800 Family history of malignant neoplasm of digestive organs: Secondary | ICD-10-CM

## 2010-10-01 DIAGNOSIS — Z5111 Encounter for antineoplastic chemotherapy: Secondary | ICD-10-CM

## 2010-10-01 DIAGNOSIS — C160 Malignant neoplasm of cardia: Secondary | ICD-10-CM

## 2010-10-01 DIAGNOSIS — C159 Malignant neoplasm of esophagus, unspecified: Secondary | ICD-10-CM

## 2010-10-01 LAB — CBC WITH DIFFERENTIAL/PLATELET
BASO%: 0.6 % (ref 0.0–2.0)
EOS%: 3.3 % (ref 0.0–7.0)
LYMPH%: 10.1 % — ABNORMAL LOW (ref 14.0–49.0)
MCH: 27.7 pg (ref 27.2–33.4)
MCHC: 33.9 g/dL (ref 32.0–36.0)
MONO#: 0.6 10*3/uL (ref 0.1–0.9)
MONO%: 8.7 % (ref 0.0–14.0)
Platelets: 287 10*3/uL (ref 140–400)
RBC: 4.51 10*6/uL (ref 4.20–5.82)
WBC: 7 10*3/uL (ref 4.0–10.3)
nRBC: 0 % (ref 0–0)

## 2010-10-03 ENCOUNTER — Ambulatory Visit (INDEPENDENT_AMBULATORY_CARE_PROVIDER_SITE_OTHER): Payer: Managed Care, Other (non HMO) | Admitting: Thoracic Surgery

## 2010-10-03 DIAGNOSIS — C159 Malignant neoplasm of esophagus, unspecified: Secondary | ICD-10-CM

## 2010-10-04 NOTE — Letter (Deleted)
October 03, 2010    Re:  Alfred Delgado, Alfred Delgado                 DOB:  [birth year]    The patient returns today.  He has had 2 chemotherapies with Dr. Truett Perna.  His blood pressure is 112/68, pulse 82, respirations 18, sats were 97%, weight was 112.2 pounds.  He is eating okay, and he did have unfortunately evidence of metastatic disease.  We will see him back again in 6 weeks and order repeat PET at that time to decide whether to proceed with surgery or not.  Ines Bloomer, M.D. Electronically Signed  DPB/MEDQ  D:  10/03/2010  T:  10/03/2010  Job:  [id number]

## 2010-10-05 ENCOUNTER — Encounter: Payer: Managed Care, Other (non HMO) | Admitting: Oncology

## 2010-10-08 ENCOUNTER — Other Ambulatory Visit: Payer: Self-pay | Admitting: Oncology

## 2010-10-08 ENCOUNTER — Encounter (HOSPITAL_BASED_OUTPATIENT_CLINIC_OR_DEPARTMENT_OTHER): Payer: Managed Care, Other (non HMO) | Admitting: Oncology

## 2010-10-08 DIAGNOSIS — C160 Malignant neoplasm of cardia: Secondary | ICD-10-CM

## 2010-10-08 DIAGNOSIS — Z800 Family history of malignant neoplasm of digestive organs: Secondary | ICD-10-CM

## 2010-10-08 DIAGNOSIS — C159 Malignant neoplasm of esophagus, unspecified: Secondary | ICD-10-CM

## 2010-10-08 DIAGNOSIS — Z5111 Encounter for antineoplastic chemotherapy: Secondary | ICD-10-CM

## 2010-10-08 DIAGNOSIS — C155 Malignant neoplasm of lower third of esophagus: Secondary | ICD-10-CM

## 2010-10-08 LAB — COMPREHENSIVE METABOLIC PANEL
AST: 20 U/L (ref 0–37)
Albumin: 3.6 g/dL (ref 3.5–5.2)
Alkaline Phosphatase: 70 U/L (ref 39–117)
Potassium: 4.1 mEq/L (ref 3.5–5.3)
Sodium: 136 mEq/L (ref 135–145)
Total Bilirubin: 0.6 mg/dL (ref 0.3–1.2)
Total Protein: 6.8 g/dL (ref 6.0–8.3)

## 2010-10-08 LAB — CBC WITH DIFFERENTIAL/PLATELET
BASO%: 0.8 % (ref 0.0–2.0)
EOS%: 3.1 % (ref 0.0–7.0)
HGB: 12.3 g/dL — ABNORMAL LOW (ref 13.0–17.1)
MCH: 27.7 pg (ref 27.2–33.4)
MCHC: 33.8 g/dL (ref 32.0–36.0)
MCV: 82 fL (ref 79.3–98.0)
MONO%: 11.2 % (ref 0.0–14.0)
RBC: 4.44 10*6/uL (ref 4.20–5.82)
RDW: 13.5 % (ref 11.0–14.6)
lymph#: 0.4 10*3/uL — ABNORMAL LOW (ref 0.9–3.3)
nRBC: 0 % (ref 0–0)

## 2010-10-15 ENCOUNTER — Other Ambulatory Visit: Payer: Self-pay | Admitting: Oncology

## 2010-10-15 ENCOUNTER — Encounter (HOSPITAL_BASED_OUTPATIENT_CLINIC_OR_DEPARTMENT_OTHER): Payer: Managed Care, Other (non HMO) | Admitting: Oncology

## 2010-10-15 DIAGNOSIS — Z5111 Encounter for antineoplastic chemotherapy: Secondary | ICD-10-CM

## 2010-10-15 DIAGNOSIS — C160 Malignant neoplasm of cardia: Secondary | ICD-10-CM

## 2010-10-15 DIAGNOSIS — C159 Malignant neoplasm of esophagus, unspecified: Secondary | ICD-10-CM

## 2010-10-15 DIAGNOSIS — C155 Malignant neoplasm of lower third of esophagus: Secondary | ICD-10-CM

## 2010-10-15 DIAGNOSIS — Z800 Family history of malignant neoplasm of digestive organs: Secondary | ICD-10-CM

## 2010-10-15 LAB — CBC WITH DIFFERENTIAL/PLATELET
Basophils Absolute: 0.1 10*3/uL (ref 0.0–0.1)
EOS%: 3.5 % (ref 0.0–7.0)
Eosinophils Absolute: 0.1 10*3/uL (ref 0.0–0.5)
LYMPH%: 10.1 % — ABNORMAL LOW (ref 14.0–49.0)
MCH: 27.8 pg (ref 27.2–33.4)
MCV: 82.9 fL (ref 79.3–98.0)
MONO%: 11.9 % (ref 0.0–14.0)
NEUT#: 3 10*3/uL (ref 1.5–6.5)
Platelets: 177 10*3/uL (ref 140–400)
RBC: 4.49 10*6/uL (ref 4.20–5.82)
RDW: 14.1 % (ref 11.0–14.6)

## 2010-10-15 NOTE — Assessment & Plan Note (Signed)
OFFICE VISIT  Alfred Delgado, Alfred Delgado DOB:  1961-04-01                                        September 10, 2010 CHART #:  [id number]  I called his house and talked to his wife, informed them that his brain scan was negative, but there were two nodes in his gastrohepatic ligament that were positive on PET.  He saw Dr. Truett Perna who proceeded with chemoradiation.  We will see him back again in 4 weeks.  Ines Bloomer, M.D. Electronically Signed  DPB/MEDQ  D:  09/10/2010  T:  09/11/2010  Job:  [id number]

## 2010-10-22 ENCOUNTER — Encounter (HOSPITAL_BASED_OUTPATIENT_CLINIC_OR_DEPARTMENT_OTHER): Payer: Managed Care, Other (non HMO) | Admitting: Oncology

## 2010-10-22 ENCOUNTER — Other Ambulatory Visit: Payer: Self-pay | Admitting: Oncology

## 2010-10-22 DIAGNOSIS — Z800 Family history of malignant neoplasm of digestive organs: Secondary | ICD-10-CM

## 2010-10-22 DIAGNOSIS — C160 Malignant neoplasm of cardia: Secondary | ICD-10-CM

## 2010-10-22 DIAGNOSIS — C159 Malignant neoplasm of esophagus, unspecified: Secondary | ICD-10-CM

## 2010-10-22 DIAGNOSIS — C155 Malignant neoplasm of lower third of esophagus: Secondary | ICD-10-CM

## 2010-10-22 DIAGNOSIS — Z5111 Encounter for antineoplastic chemotherapy: Secondary | ICD-10-CM

## 2010-10-22 LAB — CBC WITH DIFFERENTIAL/PLATELET
BASO%: 0.3 % (ref 0.0–2.0)
EOS%: 1.9 % (ref 0.0–7.0)
HCT: 34.5 % — ABNORMAL LOW (ref 38.4–49.9)
LYMPH%: 6.3 % — ABNORMAL LOW (ref 14.0–49.0)
MCH: 28.9 pg (ref 27.2–33.4)
MCHC: 34.9 g/dL (ref 32.0–36.0)
MONO%: 11.6 % (ref 0.0–14.0)
NEUT%: 79.9 % — ABNORMAL HIGH (ref 39.0–75.0)
Platelets: 172 10*3/uL (ref 140–400)
RBC: 4.17 10*6/uL — ABNORMAL LOW (ref 4.20–5.82)
WBC: 3.7 10*3/uL — ABNORMAL LOW (ref 4.0–10.3)

## 2010-10-22 LAB — COMPREHENSIVE METABOLIC PANEL
ALT: 29 U/L (ref 0–53)
AST: 30 U/L (ref 0–37)
Alkaline Phosphatase: 73 U/L (ref 39–117)
Creatinine, Ser: 0.69 mg/dL (ref 0.40–1.50)
Sodium: 135 mEq/L (ref 135–145)
Total Bilirubin: 0.7 mg/dL (ref 0.3–1.2)
Total Protein: 7.2 g/dL (ref 6.0–8.3)

## 2010-10-29 ENCOUNTER — Encounter (HOSPITAL_BASED_OUTPATIENT_CLINIC_OR_DEPARTMENT_OTHER): Payer: Managed Care, Other (non HMO) | Admitting: Oncology

## 2010-10-29 ENCOUNTER — Other Ambulatory Visit: Payer: Self-pay | Admitting: Oncology

## 2010-10-29 DIAGNOSIS — C153 Malignant neoplasm of upper third of esophagus: Secondary | ICD-10-CM

## 2010-10-29 DIAGNOSIS — C155 Malignant neoplasm of lower third of esophagus: Secondary | ICD-10-CM

## 2010-10-29 DIAGNOSIS — C159 Malignant neoplasm of esophagus, unspecified: Secondary | ICD-10-CM

## 2010-10-29 DIAGNOSIS — C160 Malignant neoplasm of cardia: Secondary | ICD-10-CM

## 2010-10-29 DIAGNOSIS — Z5111 Encounter for antineoplastic chemotherapy: Secondary | ICD-10-CM

## 2010-10-29 DIAGNOSIS — Z800 Family history of malignant neoplasm of digestive organs: Secondary | ICD-10-CM

## 2010-10-29 LAB — CBC WITH DIFFERENTIAL/PLATELET
BASO%: 0.9 % (ref 0.0–2.0)
HCT: 35.8 % — ABNORMAL LOW (ref 38.4–49.9)
LYMPH%: 6.8 % — ABNORMAL LOW (ref 14.0–49.0)
MCH: 28 pg (ref 27.2–33.4)
MCHC: 34.4 g/dL (ref 32.0–36.0)
MCV: 81.4 fL (ref 79.3–98.0)
MONO#: 0.4 10*3/uL (ref 0.1–0.9)
MONO%: 12.8 % (ref 0.0–14.0)
NEUT%: 76.5 % — ABNORMAL HIGH (ref 39.0–75.0)
Platelets: 187 10*3/uL (ref 140–400)
RBC: 4.4 10*6/uL (ref 4.20–5.82)
WBC: 3.4 10*3/uL — ABNORMAL LOW (ref 4.0–10.3)

## 2010-11-12 ENCOUNTER — Encounter (HOSPITAL_BASED_OUTPATIENT_CLINIC_OR_DEPARTMENT_OTHER): Payer: Managed Care, Other (non HMO) | Admitting: Oncology

## 2010-11-12 ENCOUNTER — Other Ambulatory Visit: Payer: Self-pay | Admitting: Oncology

## 2010-11-12 DIAGNOSIS — C155 Malignant neoplasm of lower third of esophagus: Secondary | ICD-10-CM

## 2010-11-12 DIAGNOSIS — C159 Malignant neoplasm of esophagus, unspecified: Secondary | ICD-10-CM

## 2010-11-12 DIAGNOSIS — C160 Malignant neoplasm of cardia: Secondary | ICD-10-CM

## 2010-11-12 DIAGNOSIS — Z800 Family history of malignant neoplasm of digestive organs: Secondary | ICD-10-CM

## 2010-11-12 LAB — CBC WITH DIFFERENTIAL/PLATELET
BASO%: 1.6 % (ref 0.0–2.0)
Basophils Absolute: 0.1 10*3/uL (ref 0.0–0.1)
EOS%: 1.9 % (ref 0.0–7.0)
HGB: 11.8 g/dL — ABNORMAL LOW (ref 13.0–17.1)
MCH: 28.8 pg (ref 27.2–33.4)
MCHC: 34.7 g/dL (ref 32.0–36.0)
MCV: 82.9 fL (ref 79.3–98.0)
MONO%: 12.9 % (ref 0.0–14.0)
NEUT%: 65.3 % (ref 39.0–75.0)
RDW: 17.8 % — ABNORMAL HIGH (ref 11.0–14.6)

## 2010-11-13 NOTE — Assessment & Plan Note (Addendum)
OFFICE VISIT  Alfred Delgado, Alfred Delgado DOB:  1960/12/16                                        October 03, 2010 CHART #:  [id number]  The patient returns today.  He has had 2 chemotherapies with Dr. Truett Perna.  His blood pressure is 112/68, pulse 82, respirations 18, sats were 97%, weight was 112.2 pounds.  He is eating okay, and he did have unfortunately evidence of metastatic disease.  We will see him back again in 6 weeks and order repeat PET at that time to decide whether to proceed with surgery or not.  Ines Bloomer, M.D. Electronically Signed  DPB/MEDQ  D:  10/03/2010  T:  10/03/2010  Job:  [id number]

## 2010-11-14 ENCOUNTER — Ambulatory Visit (INDEPENDENT_AMBULATORY_CARE_PROVIDER_SITE_OTHER): Payer: Managed Care, Other (non HMO) | Admitting: Thoracic Surgery

## 2010-11-14 DIAGNOSIS — C159 Malignant neoplasm of esophagus, unspecified: Secondary | ICD-10-CM

## 2010-11-15 NOTE — Letter (Signed)
Nov 14, 2010  Dr. Evelena Leyden  Re:  Alfred Delgado                 DOB:  06-22-1961  Dear [name];  I saw the patient now.  He has completed his chemotherapy and radiation and he has scheduled to get a PET scan in approximately 10 days at [facility] and will see Dr. Arbie Cookey at the Thoracic Surgery Department for a second opinion.  He and I discussed the possibilities of resection and I did say that this would depend the more we saw at his PET scan.  As you know, he had fairly extensive nodal disease.  He asked about minimally invasive esophagectomy and I told him that, that would probably not be the best given his extensive nodal disease.  I am actually not too sure how to proceed with surgery given the amount of nodal disease he has, but I will wait and see the result of the secondary PET .  I would lean more toward doing a transhiatal esophagectomy with an extensive abdominal node dissection rather than an Ivor Lewis approach or minimally invasive approach.  His blood pressure was 122/74, pulse 81, respirations 20 and sats were 98%.  I will see him back again a week after he gets his PET scan.  Ines Bloomer, M.D. Electronically Signed  DPB/MEDQ  D:  11/14/2010  T:  11/15/2010  Job:  [id number]

## 2010-11-28 ENCOUNTER — Ambulatory Visit (INDEPENDENT_AMBULATORY_CARE_PROVIDER_SITE_OTHER): Payer: Managed Care, Other (non HMO) | Admitting: Thoracic Surgery

## 2010-11-28 DIAGNOSIS — C159 Malignant neoplasm of esophagus, unspecified: Secondary | ICD-10-CM

## 2010-11-28 NOTE — Letter (Signed)
Nov 28, 2010  Dr. Mancel Bale  Re:  Alfred Delgado                 DOB:  Apr 23, 1961  Dear [name],  The patient is back today, saw Dr. Glenna Durand a week ago, and the best I can tell from his description, she recommended a 3-hole procedure.  She has gotten a very good result on his PET scan with no residual uptake in the lymph nodes and the scarring where he had his radiation.  His blood pressure is 109/65, pulse 84, respirations 18, sats were 95%, weight was 211.  We will discuss with him about the options of doing the surgery either at [facility] or here, and he will make a decision in the next 2-3 days.  Unfortunately, we did not get the PET scan sent to [place] directly, so we will obtain that in the next day or two before making our final recommendation.  I appreciate the opportunity of seeing the patient.  Sincerely,  Ines Bloomer, M.D. Electronically Signed  DPB/MEDQ  D:  11/28/2010  T:  11/28/2010  Job:  [id number]  cc:   Vikki Ports A. Felicity Coyer, MD Rachael Fee, MD Dr. Arbie Cookey

## 2010-12-07 ENCOUNTER — Encounter (HOSPITAL_BASED_OUTPATIENT_CLINIC_OR_DEPARTMENT_OTHER): Payer: Managed Care, Other (non HMO) | Admitting: Oncology

## 2010-12-07 ENCOUNTER — Other Ambulatory Visit: Payer: Self-pay | Admitting: Oncology

## 2010-12-07 ENCOUNTER — Ambulatory Visit: Payer: Managed Care, Other (non HMO) | Admitting: Radiation Oncology

## 2010-12-07 DIAGNOSIS — C160 Malignant neoplasm of cardia: Secondary | ICD-10-CM

## 2010-12-07 DIAGNOSIS — C159 Malignant neoplasm of esophagus, unspecified: Secondary | ICD-10-CM

## 2010-12-07 DIAGNOSIS — Z800 Family history of malignant neoplasm of digestive organs: Secondary | ICD-10-CM

## 2010-12-07 DIAGNOSIS — C155 Malignant neoplasm of lower third of esophagus: Secondary | ICD-10-CM

## 2010-12-07 LAB — CBC WITH DIFFERENTIAL/PLATELET
BASO%: 1.4 % (ref 0.0–2.0)
EOS%: 3.2 % (ref 0.0–7.0)
MCH: 30.6 pg (ref 27.2–33.4)
MCV: 87 fL (ref 79.3–98.0)
MONO%: 14.6 % — ABNORMAL HIGH (ref 0.0–14.0)
NEUT#: 1.6 10*3/uL (ref 1.5–6.5)
RBC: 4 10*6/uL — ABNORMAL LOW (ref 4.20–5.82)
RDW: 20.5 % — ABNORMAL HIGH (ref 11.0–14.6)

## 2011-01-24 ENCOUNTER — Encounter (HOSPITAL_BASED_OUTPATIENT_CLINIC_OR_DEPARTMENT_OTHER): Payer: Managed Care, Other (non HMO) | Admitting: Oncology

## 2011-01-24 DIAGNOSIS — C160 Malignant neoplasm of cardia: Secondary | ICD-10-CM

## 2011-02-18 ENCOUNTER — Encounter (HOSPITAL_BASED_OUTPATIENT_CLINIC_OR_DEPARTMENT_OTHER): Payer: Managed Care, Other (non HMO) | Admitting: Oncology

## 2011-02-18 DIAGNOSIS — C160 Malignant neoplasm of cardia: Secondary | ICD-10-CM

## 2011-03-06 ENCOUNTER — Encounter (HOSPITAL_BASED_OUTPATIENT_CLINIC_OR_DEPARTMENT_OTHER): Payer: Managed Care, Other (non HMO) | Admitting: Oncology

## 2011-03-07 ENCOUNTER — Other Ambulatory Visit: Payer: Self-pay | Admitting: Oncology

## 2011-03-07 DIAGNOSIS — C159 Malignant neoplasm of esophagus, unspecified: Secondary | ICD-10-CM

## 2011-03-13 ENCOUNTER — Encounter (HOSPITAL_BASED_OUTPATIENT_CLINIC_OR_DEPARTMENT_OTHER): Payer: Managed Care, Other (non HMO) | Admitting: Oncology

## 2011-03-13 ENCOUNTER — Other Ambulatory Visit: Payer: Self-pay | Admitting: Oncology

## 2011-03-13 DIAGNOSIS — D702 Other drug-induced agranulocytosis: Secondary | ICD-10-CM

## 2011-03-13 DIAGNOSIS — C160 Malignant neoplasm of cardia: Secondary | ICD-10-CM

## 2011-03-13 DIAGNOSIS — Z86718 Personal history of other venous thrombosis and embolism: Secondary | ICD-10-CM

## 2011-03-13 LAB — CBC WITH DIFFERENTIAL/PLATELET
Eosinophils Absolute: 0.1 10*3/uL (ref 0.0–0.5)
MONO#: 0.3 10*3/uL (ref 0.1–0.9)
MONO%: 13.1 % (ref 0.0–14.0)
NEUT#: 1.4 10*3/uL — ABNORMAL LOW (ref 1.5–6.5)
RBC: 3.94 10*6/uL — ABNORMAL LOW (ref 4.20–5.82)
RDW: 13.6 % (ref 11.0–14.6)
WBC: 2.4 10*3/uL — ABNORMAL LOW (ref 4.0–10.3)
lymph#: 0.6 10*3/uL — ABNORMAL LOW (ref 0.9–3.3)

## 2011-03-13 LAB — COMPREHENSIVE METABOLIC PANEL
Albumin: 3.8 g/dL (ref 3.5–5.2)
Alkaline Phosphatase: 74 U/L (ref 39–117)
CO2: 29 mEq/L (ref 19–32)
Glucose, Bld: 90 mg/dL (ref 70–99)
Potassium: 3.6 mEq/L (ref 3.5–5.3)
Sodium: 137 mEq/L (ref 135–145)
Total Protein: 6.6 g/dL (ref 6.0–8.3)

## 2011-03-14 ENCOUNTER — Encounter (HOSPITAL_BASED_OUTPATIENT_CLINIC_OR_DEPARTMENT_OTHER): Payer: Managed Care, Other (non HMO) | Admitting: Oncology

## 2011-03-14 ENCOUNTER — Ambulatory Visit (HOSPITAL_COMMUNITY)
Admission: RE | Admit: 2011-03-14 | Discharge: 2011-03-14 | Disposition: A | Discharge status: Home / Self Care | Payer: Managed Care, Other (non HMO) | Source: Ambulatory Visit | Attending: Oncology | Admitting: Oncology

## 2011-03-14 ENCOUNTER — Other Ambulatory Visit: Payer: Self-pay | Admitting: Oncology

## 2011-03-14 DIAGNOSIS — C159 Malignant neoplasm of esophagus, unspecified: Secondary | ICD-10-CM

## 2011-03-14 DIAGNOSIS — C160 Malignant neoplasm of cardia: Secondary | ICD-10-CM

## 2011-03-14 DIAGNOSIS — Z79899 Other long term (current) drug therapy: Secondary | ICD-10-CM | POA: Insufficient documentation

## 2011-03-14 DIAGNOSIS — Z5111 Encounter for antineoplastic chemotherapy: Secondary | ICD-10-CM

## 2011-03-14 HISTORY — PX: PORTACATH PLACEMENT: SHX2246

## 2011-03-16 ENCOUNTER — Encounter (HOSPITAL_BASED_OUTPATIENT_CLINIC_OR_DEPARTMENT_OTHER): Payer: Managed Care, Other (non HMO) | Admitting: Oncology

## 2011-03-16 DIAGNOSIS — C160 Malignant neoplasm of cardia: Secondary | ICD-10-CM

## 2011-03-27 ENCOUNTER — Encounter (HOSPITAL_BASED_OUTPATIENT_CLINIC_OR_DEPARTMENT_OTHER): Payer: Managed Care, Other (non HMO) | Admitting: Oncology

## 2011-03-27 ENCOUNTER — Other Ambulatory Visit: Payer: Self-pay | Admitting: Oncology

## 2011-03-27 DIAGNOSIS — C160 Malignant neoplasm of cardia: Secondary | ICD-10-CM

## 2011-03-27 LAB — CBC WITH DIFFERENTIAL/PLATELET
BASO%: 0.8 % (ref 0.0–2.0)
EOS%: 2.9 % (ref 0.0–7.0)
Eosinophils Absolute: 0 10*3/uL (ref 0.0–0.5)
MCV: 86.4 fL (ref 79.3–98.0)
MONO%: 12.7 % (ref 0.0–14.0)
NEUT#: 0.9 10*3/uL — ABNORMAL LOW (ref 1.5–6.5)
RBC: 3.86 10*6/uL — ABNORMAL LOW (ref 4.20–5.82)
RDW: 14.1 % (ref 11.0–14.6)

## 2011-03-27 LAB — COMPREHENSIVE METABOLIC PANEL
ALT: 40 U/L (ref 0–53)
AST: 33 U/L (ref 0–37)
Albumin: 3.7 g/dL (ref 3.5–5.2)
Alkaline Phosphatase: 67 U/L (ref 39–117)
Glucose, Bld: 105 mg/dL — ABNORMAL HIGH (ref 70–99)
Potassium: 3.8 mEq/L (ref 3.5–5.3)
Sodium: 138 mEq/L (ref 135–145)
Total Protein: 6.7 g/dL (ref 6.0–8.3)

## 2011-04-01 ENCOUNTER — Other Ambulatory Visit: Payer: Self-pay | Admitting: Oncology

## 2011-04-01 ENCOUNTER — Encounter (HOSPITAL_BASED_OUTPATIENT_CLINIC_OR_DEPARTMENT_OTHER): Payer: Managed Care, Other (non HMO) | Admitting: Oncology

## 2011-04-01 DIAGNOSIS — C160 Malignant neoplasm of cardia: Secondary | ICD-10-CM

## 2011-04-01 LAB — CBC WITH DIFFERENTIAL/PLATELET
Basophils Absolute: 0 10*3/uL (ref 0.0–0.1)
EOS%: 3.6 % (ref 0.0–7.0)
Eosinophils Absolute: 0.1 10*3/uL (ref 0.0–0.5)
HGB: 11.6 g/dL — ABNORMAL LOW (ref 13.0–17.1)
LYMPH%: 30.2 % (ref 14.0–49.0)
MCH: 29 pg (ref 27.2–33.4)
MCV: 86 fL (ref 79.3–98.0)
MONO%: 13 % (ref 0.0–14.0)
NEUT#: 0.9 10*3/uL — ABNORMAL LOW (ref 1.5–6.5)
NEUT%: 51.4 % (ref 39.0–75.0)
Platelets: 154 10*3/uL (ref 140–400)

## 2011-04-08 ENCOUNTER — Encounter (HOSPITAL_BASED_OUTPATIENT_CLINIC_OR_DEPARTMENT_OTHER): Payer: Managed Care, Other (non HMO) | Admitting: Oncology

## 2011-04-08 ENCOUNTER — Other Ambulatory Visit: Payer: Self-pay | Admitting: Oncology

## 2011-04-08 DIAGNOSIS — Z5111 Encounter for antineoplastic chemotherapy: Secondary | ICD-10-CM

## 2011-04-08 DIAGNOSIS — C160 Malignant neoplasm of cardia: Secondary | ICD-10-CM

## 2011-04-08 LAB — CBC WITH DIFFERENTIAL/PLATELET
BASO%: 1.5 % (ref 0.0–2.0)
EOS%: 1 % (ref 0.0–7.0)
Eosinophils Absolute: 0 10*3/uL (ref 0.0–0.5)
LYMPH%: 30.1 % (ref 14.0–49.0)
MCH: 28.6 pg (ref 27.2–33.4)
MCHC: 33.3 g/dL (ref 32.0–36.0)
MCV: 85.7 fL (ref 79.3–98.0)
MONO%: 20.9 % — ABNORMAL HIGH (ref 0.0–14.0)
Platelets: 173 10*3/uL (ref 140–400)
RBC: 4.13 10*6/uL — ABNORMAL LOW (ref 4.20–5.82)
nRBC: 0 % (ref 0–0)

## 2011-04-08 LAB — COMPREHENSIVE METABOLIC PANEL
ALT: 32 U/L (ref 0–53)
Alkaline Phosphatase: 72 U/L (ref 39–117)
CO2: 27 mEq/L (ref 19–32)
Creatinine, Ser: 0.68 mg/dL (ref 0.50–1.35)
Total Bilirubin: 0.3 mg/dL (ref 0.3–1.2)

## 2011-04-10 ENCOUNTER — Encounter (HOSPITAL_BASED_OUTPATIENT_CLINIC_OR_DEPARTMENT_OTHER): Payer: Self-pay | Admitting: Oncology

## 2011-04-10 DIAGNOSIS — C160 Malignant neoplasm of cardia: Secondary | ICD-10-CM

## 2011-04-10 DIAGNOSIS — Z5189 Encounter for other specified aftercare: Secondary | ICD-10-CM

## 2011-04-22 ENCOUNTER — Other Ambulatory Visit: Payer: Self-pay | Admitting: Oncology

## 2011-04-22 ENCOUNTER — Encounter (HOSPITAL_BASED_OUTPATIENT_CLINIC_OR_DEPARTMENT_OTHER): Payer: Self-pay | Admitting: Oncology

## 2011-04-22 DIAGNOSIS — C160 Malignant neoplasm of cardia: Secondary | ICD-10-CM

## 2011-04-22 DIAGNOSIS — Z86718 Personal history of other venous thrombosis and embolism: Secondary | ICD-10-CM

## 2011-04-22 DIAGNOSIS — Z5111 Encounter for antineoplastic chemotherapy: Secondary | ICD-10-CM

## 2011-04-22 DIAGNOSIS — D709 Neutropenia, unspecified: Secondary | ICD-10-CM

## 2011-04-22 LAB — CBC WITH DIFFERENTIAL/PLATELET
Eosinophils Absolute: 0.1 10*3/uL (ref 0.0–0.5)
HCT: 34.8 % — ABNORMAL LOW (ref 38.4–49.9)
LYMPH%: 13.7 % — ABNORMAL LOW (ref 14.0–49.0)
MCHC: 34.7 g/dL (ref 32.0–36.0)
MONO#: 0.2 10*3/uL (ref 0.1–0.9)
NEUT%: 78.4 % — ABNORMAL HIGH (ref 39.0–75.0)
Platelets: 124 10*3/uL — ABNORMAL LOW (ref 140–400)
WBC: 3.9 10*3/uL — ABNORMAL LOW (ref 4.0–10.3)

## 2011-04-24 ENCOUNTER — Encounter (HOSPITAL_BASED_OUTPATIENT_CLINIC_OR_DEPARTMENT_OTHER): Payer: Managed Care, Other (non HMO) | Admitting: Oncology

## 2011-04-24 DIAGNOSIS — C160 Malignant neoplasm of cardia: Secondary | ICD-10-CM

## 2011-04-27 ENCOUNTER — Other Ambulatory Visit: Payer: Self-pay | Admitting: *Deleted

## 2011-04-27 DIAGNOSIS — C159 Malignant neoplasm of esophagus, unspecified: Secondary | ICD-10-CM

## 2011-05-06 ENCOUNTER — Other Ambulatory Visit: Payer: Self-pay | Admitting: Oncology

## 2011-05-06 ENCOUNTER — Telehealth: Payer: Self-pay | Admitting: Oncology

## 2011-05-06 ENCOUNTER — Encounter (HOSPITAL_BASED_OUTPATIENT_CLINIC_OR_DEPARTMENT_OTHER): Payer: Managed Care, Other (non HMO) | Admitting: Oncology

## 2011-05-06 DIAGNOSIS — Z5111 Encounter for antineoplastic chemotherapy: Secondary | ICD-10-CM

## 2011-05-06 DIAGNOSIS — D702 Other drug-induced agranulocytosis: Secondary | ICD-10-CM

## 2011-05-06 DIAGNOSIS — I82509 Chronic embolism and thrombosis of unspecified deep veins of unspecified lower extremity: Secondary | ICD-10-CM

## 2011-05-06 DIAGNOSIS — C160 Malignant neoplasm of cardia: Secondary | ICD-10-CM

## 2011-05-06 LAB — COMPREHENSIVE METABOLIC PANEL
ALT: 31 U/L (ref 0–53)
Albumin: 4.2 g/dL (ref 3.5–5.2)
Alkaline Phosphatase: 107 U/L (ref 39–117)
CO2: 27 mEq/L (ref 19–32)
Glucose, Bld: 98 mg/dL (ref 70–99)
Potassium: 4.2 mEq/L (ref 3.5–5.3)
Sodium: 139 mEq/L (ref 135–145)
Total Bilirubin: 0.3 mg/dL (ref 0.3–1.2)
Total Protein: 6.2 g/dL (ref 6.0–8.3)

## 2011-05-06 LAB — CBC WITH DIFFERENTIAL/PLATELET
Basophils Absolute: 0 10*3/uL (ref 0.0–0.1)
Eosinophils Absolute: 0.1 10*3/uL (ref 0.0–0.5)
HGB: 12.3 g/dL — ABNORMAL LOW (ref 13.0–17.1)
NEUT#: 3.7 10*3/uL (ref 1.5–6.5)
RBC: 4.13 10*6/uL — ABNORMAL LOW (ref 4.20–5.82)
RDW: 18.3 % — ABNORMAL HIGH (ref 11.0–14.6)
WBC: 4.8 10*3/uL (ref 4.0–10.3)
lymph#: 0.7 10*3/uL — ABNORMAL LOW (ref 0.9–3.3)
nRBC: 0 % (ref 0–0)

## 2011-05-06 NOTE — Telephone Encounter (Signed)
gv pt appt schedule for nov. No new orders.

## 2011-05-08 ENCOUNTER — Encounter (HOSPITAL_BASED_OUTPATIENT_CLINIC_OR_DEPARTMENT_OTHER): Payer: Managed Care, Other (non HMO) | Admitting: Oncology

## 2011-05-08 DIAGNOSIS — C160 Malignant neoplasm of cardia: Secondary | ICD-10-CM

## 2011-05-19 ENCOUNTER — Other Ambulatory Visit: Payer: Self-pay | Admitting: Oncology

## 2011-05-20 ENCOUNTER — Ambulatory Visit (HOSPITAL_BASED_OUTPATIENT_CLINIC_OR_DEPARTMENT_OTHER): Payer: Managed Care, Other (non HMO)

## 2011-05-20 ENCOUNTER — Telehealth: Payer: Self-pay | Admitting: Oncology

## 2011-05-20 ENCOUNTER — Other Ambulatory Visit: Payer: Self-pay | Admitting: Certified Registered Nurse Anesthetist

## 2011-05-20 ENCOUNTER — Other Ambulatory Visit (HOSPITAL_BASED_OUTPATIENT_CLINIC_OR_DEPARTMENT_OTHER): Payer: Managed Care, Other (non HMO) | Admitting: Lab

## 2011-05-20 ENCOUNTER — Ambulatory Visit (HOSPITAL_BASED_OUTPATIENT_CLINIC_OR_DEPARTMENT_OTHER): Payer: Managed Care, Other (non HMO) | Admitting: Nurse Practitioner

## 2011-05-20 ENCOUNTER — Other Ambulatory Visit: Payer: Self-pay | Admitting: Oncology

## 2011-05-20 VITALS — BP 101/62 | HR 65 | Temp 97.1°F | Ht 75.0 in | Wt 190.2 lb

## 2011-05-20 DIAGNOSIS — I82509 Chronic embolism and thrombosis of unspecified deep veins of unspecified lower extremity: Secondary | ICD-10-CM

## 2011-05-20 DIAGNOSIS — C160 Malignant neoplasm of cardia: Secondary | ICD-10-CM

## 2011-05-20 DIAGNOSIS — C159 Malignant neoplasm of esophagus, unspecified: Secondary | ICD-10-CM

## 2011-05-20 DIAGNOSIS — Z5111 Encounter for antineoplastic chemotherapy: Secondary | ICD-10-CM

## 2011-05-20 LAB — COMPREHENSIVE METABOLIC PANEL
AST: 19 U/L (ref 0–37)
Albumin: 4.1 g/dL (ref 3.5–5.2)
Alkaline Phosphatase: 133 U/L — ABNORMAL HIGH (ref 39–117)
BUN: 9 mg/dL (ref 6–23)
Glucose, Bld: 95 mg/dL (ref 70–99)
Potassium: 4.1 mEq/L (ref 3.5–5.3)
Total Bilirubin: 0.2 mg/dL — ABNORMAL LOW (ref 0.3–1.2)

## 2011-05-20 LAB — CBC WITH DIFFERENTIAL/PLATELET
BASO%: 0.8 % (ref 0.0–2.0)
Basophils Absolute: 0.1 10*3/uL (ref 0.0–0.1)
Basophils Absolute: 0.1 10*3/uL (ref 0.0–0.1)
EOS%: 1.4 % (ref 0.0–7.0)
Eosinophils Absolute: 0.1 10*3/uL (ref 0.0–0.5)
HCT: 33.3 % — ABNORMAL LOW (ref 38.4–49.9)
HCT: 33.3 % — ABNORMAL LOW (ref 38.4–49.9)
HGB: 11.2 g/dL — ABNORMAL LOW (ref 13.0–17.1)
LYMPH%: 10.4 % — ABNORMAL LOW (ref 14.0–49.0)
LYMPH%: 10.4 % — ABNORMAL LOW (ref 14.0–49.0)
MCH: 30.2 pg (ref 27.2–33.4)
MCHC: 33.6 g/dL (ref 32.0–36.0)
MCV: 89.8 fL (ref 79.3–98.0)
MCV: 89.8 fL (ref 79.3–98.0)
MONO%: 10.8 % (ref 0.0–14.0)
NEUT#: 5.9 10*3/uL (ref 1.5–6.5)
NEUT%: 76.6 % — ABNORMAL HIGH (ref 39.0–75.0)
NEUT%: 76.6 % — ABNORMAL HIGH (ref 39.0–75.0)
Platelets: 132 10*3/uL — ABNORMAL LOW (ref 140–400)
Platelets: 132 10*3/uL — ABNORMAL LOW (ref 140–400)
RBC: 3.71 10*6/uL — ABNORMAL LOW (ref 4.20–5.82)

## 2011-05-20 MED ORDER — ONDANSETRON 8 MG/50ML IVPB (CHCC)
8.0000 mg | Freq: Once | INTRAVENOUS | Status: AC
  Administered 2011-05-20: 8 mg via INTRAVENOUS

## 2011-05-20 MED ORDER — DEXAMETHASONE SODIUM PHOSPHATE 10 MG/ML IJ SOLN
10.0000 mg | Freq: Once | INTRAMUSCULAR | Status: AC
  Administered 2011-05-20: 10 mg via INTRAVENOUS

## 2011-05-20 MED ORDER — LEUCOVORIN CALCIUM INJECTION 350 MG
286.0000 mg/m2 | Freq: Once | INTRAVENOUS | Status: AC
  Administered 2011-05-20: 640 mg via INTRAVENOUS
  Filled 2011-05-20: qty 32

## 2011-05-20 MED ORDER — FLUOROURACIL CHEMO INJECTION 2.5 GM/50ML
300.0000 mg/m2 | Freq: Once | INTRAVENOUS | Status: AC
  Administered 2011-05-20: 650 mg via INTRAVENOUS
  Filled 2011-05-20: qty 13

## 2011-05-20 MED ORDER — DEXTROSE 5 % IV SOLN
Freq: Once | INTRAVENOUS | Status: AC
  Administered 2011-05-20: 13:00:00 via INTRAVENOUS

## 2011-05-20 MED ORDER — OXALIPLATIN CHEMO INJECTION 100 MG/20ML
62.0000 mg/m2 | Freq: Once | INTRAVENOUS | Status: AC
  Administered 2011-05-20: 140 mg via INTRAVENOUS
  Filled 2011-05-20: qty 28

## 2011-05-20 MED ORDER — SODIUM CHLORIDE 0.9 % IV SOLN
1750.0000 mg/m2 | INTRAVENOUS | Status: DC
  Administered 2011-05-20: 3900 mg via INTRAVENOUS
  Filled 2011-05-20: qty 78

## 2011-05-20 NOTE — Progress Notes (Signed)
OFFICE PROGRESS NOTE  Interval history:  Alfred Delgado returns as scheduled. He completed cycle 4 FOLFOX 05/06/2011. He overall feels well. He denies nausea or vomiting. No mouth sores. No diarrhea. He noted cold sensitivity lasting one to 2 days following the most recent cycle of chemotherapy. He denies persistent neuropathy symptoms. He has a good appetite. He denies difficulty swallowing. He continues twice daily Lovenox. He denies bleeding.   Objective: Blood pressure 101/62, pulse 65, temperature 97.1 F (36.2 C), height 6\' 3"  (1.905 m), weight 190 lb 3.2 oz (86.274 kg).  Oropharynx is without thrush or ulceration. Lungs are clear. No wheezes or rales. Regular cardiac rhythm. Port-A-Cath site is nontender without erythema. Abdomen is soft and nontender. No hepatomegaly. Trace lower leg edema bilaterally. Vibratory sense is intact over the fingertips bilaterally.   Lab Results:  Hemoglobin 11.2 white count 7.7 absolute neutrophil count 5.9 platelet count 132,000.   Studies/Results: No results found.  Medications: I have reviewed the patient's current medications.  Assessment/Plan: 1. 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  A fine-needle aspiration of a hepatoduodenal lymph node confirmed adenocarcinoma.  A staging PET scan 09/10/2010 revealed hypermetabolism corresponding to wall thickening of the distal esophagus with an SUV of 13.8 and 2 hypermetabolic hepatoduodenal ligament lymph nodes.   a. He began concurrent Taxol/carboplatin chemotherapy and radiation 09/24/2010.  Radiation was completed 11/01/2010 and the last chemotherapy was given 10/29/2010.   b. Restaging x-ray evaluation at [facility] 11/21/2010 revealed no evidence of distant metastatic disease.   c. Status post an esophagogastrectomy 12/24/2010 with pathology showing a 2.5 cm poorly differentiated adenocarcinoma of the distal esophagus with invasion through the full thickness of the muscularis propria and  into the paraesophageal soft tissue with negative surgical margins.  No lymphovascular invasion and no perineural invasion were identified.  Metastatic adenocarcinoma was identified in 7/13 regional lymph nodes (yPT3 yPN3).  2. Postoperative left lower extremity deep vein thrombosis, maintained on Lovenox. 3. Nutrition, now maintained a regular diet. 4. Left calf pain 09/20/2010.  A venous Doppler was negative for deep vein thrombosis or superficial thrombosis. 5. Baseline tinnitus of the left ear. 6. Multiple subcutaneous nodular lesions, likely lipomas. 7. History of diverticulitis. 8. History of colonic polyps. 9. Family history of esophagus cancer. 10. Superficial phlebitis of the left arm 10/08/2010. 11. Delayed nausea following cycle #1 of FOLFOX, Aloxi was added with cycle #2. 12.   Neutropenia secondary to chemotherapy, cycle number 2 of FOLFOX was delayed.   Chemotherapy was dose reduced and he received Neulasta beginning with cycle number 2.    Disposition: Alfred Delgado appears stable. Plan to proceed with cycle 5 of FOLFOX today as scheduled. He will again receive Neulasta support. He will return for a followup visit with Dr. Truett Perna and cycle 6 FOLFOX in 2 weeks. He will contact the office in the interim with any problems.  Plan reviewed with Dr. Truett Perna.   Lonna Cobb ANP/GNP-BC

## 2011-05-20 NOTE — Telephone Encounter (Signed)
gve the pt his nov,dec 2012 appt calendar °

## 2011-05-22 ENCOUNTER — Ambulatory Visit (HOSPITAL_BASED_OUTPATIENT_CLINIC_OR_DEPARTMENT_OTHER): Payer: Managed Care, Other (non HMO)

## 2011-05-22 VITALS — BP 95/57 | HR 66 | Temp 97.1°F

## 2011-05-22 DIAGNOSIS — C160 Malignant neoplasm of cardia: Secondary | ICD-10-CM

## 2011-05-22 DIAGNOSIS — Z5189 Encounter for other specified aftercare: Secondary | ICD-10-CM

## 2011-05-22 DIAGNOSIS — C159 Malignant neoplasm of esophagus, unspecified: Secondary | ICD-10-CM

## 2011-05-22 MED ORDER — HEPARIN SOD (PORK) LOCK FLUSH 100 UNIT/ML IV SOLN
500.0000 [IU] | Freq: Once | INTRAVENOUS | Status: AC | PRN
  Administered 2011-05-22: 500 [IU]
  Filled 2011-05-22: qty 5

## 2011-05-22 MED ORDER — PEGFILGRASTIM INJECTION 6 MG/0.6ML
6.0000 mg | Freq: Once | SUBCUTANEOUS | Status: AC
  Administered 2011-05-22: 6 mg via SUBCUTANEOUS
  Filled 2011-05-22: qty 0.6

## 2011-05-22 MED ORDER — SODIUM CHLORIDE 0.9 % IJ SOLN
10.0000 mL | INTRAMUSCULAR | Status: DC | PRN
  Administered 2011-05-22: 10 mL
  Filled 2011-05-22: qty 10

## 2011-05-22 NOTE — Patient Instructions (Signed)
Cal MD for problems 

## 2011-05-31 ENCOUNTER — Other Ambulatory Visit: Payer: Self-pay | Admitting: *Deleted

## 2011-06-03 ENCOUNTER — Other Ambulatory Visit: Payer: Self-pay | Admitting: Oncology

## 2011-06-03 ENCOUNTER — Other Ambulatory Visit (HOSPITAL_BASED_OUTPATIENT_CLINIC_OR_DEPARTMENT_OTHER): Payer: Managed Care, Other (non HMO) | Admitting: Lab

## 2011-06-03 ENCOUNTER — Ambulatory Visit (HOSPITAL_BASED_OUTPATIENT_CLINIC_OR_DEPARTMENT_OTHER): Payer: Managed Care, Other (non HMO)

## 2011-06-03 ENCOUNTER — Ambulatory Visit (HOSPITAL_BASED_OUTPATIENT_CLINIC_OR_DEPARTMENT_OTHER): Payer: Managed Care, Other (non HMO) | Admitting: Oncology

## 2011-06-03 VITALS — BP 110/68 | HR 65 | Temp 96.8°F | Ht 75.0 in | Wt 193.1 lb

## 2011-06-03 DIAGNOSIS — C160 Malignant neoplasm of cardia: Secondary | ICD-10-CM

## 2011-06-03 DIAGNOSIS — Z7901 Long term (current) use of anticoagulants: Secondary | ICD-10-CM

## 2011-06-03 DIAGNOSIS — Z5111 Encounter for antineoplastic chemotherapy: Secondary | ICD-10-CM

## 2011-06-03 DIAGNOSIS — R61 Generalized hyperhidrosis: Secondary | ICD-10-CM

## 2011-06-03 DIAGNOSIS — Z86718 Personal history of other venous thrombosis and embolism: Secondary | ICD-10-CM

## 2011-06-03 DIAGNOSIS — C159 Malignant neoplasm of esophagus, unspecified: Secondary | ICD-10-CM

## 2011-06-03 LAB — CBC WITH DIFFERENTIAL/PLATELET
Basophils Absolute: 0.1 10*3/uL (ref 0.0–0.1)
Eosinophils Absolute: 0.2 10*3/uL (ref 0.0–0.5)
HCT: 35.1 % — ABNORMAL LOW (ref 38.4–49.9)
HGB: 11.8 g/dL — ABNORMAL LOW (ref 13.0–17.1)
MCV: 91.4 fL (ref 79.3–98.0)
NEUT#: 6.7 10*3/uL — ABNORMAL HIGH (ref 1.5–6.5)
RDW: 20.9 % — ABNORMAL HIGH (ref 11.0–14.6)
lymph#: 0.9 10*3/uL (ref 0.9–3.3)

## 2011-06-03 LAB — COMPREHENSIVE METABOLIC PANEL
ALT: 29 U/L (ref 0–53)
AST: 33 U/L (ref 0–37)
Albumin: 4.1 g/dL (ref 3.5–5.2)
Alkaline Phosphatase: 139 U/L — ABNORMAL HIGH (ref 39–117)
BUN: 8 mg/dL (ref 6–23)
Calcium: 9.1 mg/dL (ref 8.4–10.5)
Chloride: 103 mEq/L (ref 96–112)
Potassium: 4.1 mEq/L (ref 3.5–5.3)
Sodium: 137 mEq/L (ref 135–145)
Total Protein: 6.8 g/dL (ref 6.0–8.3)

## 2011-06-03 MED ORDER — OXALIPLATIN CHEMO INJECTION 100 MG/20ML
62.0000 mg/m2 | Freq: Once | INTRAVENOUS | Status: AC
  Administered 2011-06-03: 140 mg via INTRAVENOUS
  Filled 2011-06-03: qty 28

## 2011-06-03 MED ORDER — FLUOROURACIL CHEMO INJECTION 5 GM/100ML
1750.0000 mg/m2 | INTRAVENOUS | Status: DC
  Administered 2011-06-03: 3900 mg via INTRAVENOUS
  Filled 2011-06-03: qty 78

## 2011-06-03 MED ORDER — LEUCOVORIN CALCIUM INJECTION 350 MG
286.0000 mg/m2 | Freq: Once | INTRAVENOUS | Status: AC
  Administered 2011-06-03: 640 mg via INTRAVENOUS
  Filled 2011-06-03: qty 32

## 2011-06-03 MED ORDER — DEXAMETHASONE SODIUM PHOSPHATE 10 MG/ML IJ SOLN
10.0000 mg | Freq: Once | INTRAMUSCULAR | Status: AC
  Administered 2011-06-03: 10 mg via INTRAVENOUS

## 2011-06-03 MED ORDER — ONDANSETRON 8 MG/50ML IVPB (CHCC)
8.0000 mg | Freq: Once | INTRAVENOUS | Status: AC
  Administered 2011-06-03: 8 mg via INTRAVENOUS

## 2011-06-03 MED ORDER — DEXTROSE 5 % IV SOLN
Freq: Once | INTRAVENOUS | Status: AC
  Administered 2011-06-03: 14:00:00 via INTRAVENOUS

## 2011-06-03 MED ORDER — FLUOROURACIL CHEMO INJECTION 2.5 GM/50ML
300.0000 mg/m2 | Freq: Once | INTRAVENOUS | Status: AC
  Administered 2011-06-03: 650 mg via INTRAVENOUS
  Filled 2011-06-03: qty 13

## 2011-06-03 NOTE — Progress Notes (Signed)
OFFICE PROGRESS NOTE  Interval history:  Alfred Delgado returns as scheduled. He completed cycle 5 FOLFOX on November 12. He overall feels well. He denies nausea or vomiting. No mouth sores. No diarrhea. He denies neuropathy symptoms. He has a good appetite. He denies difficulty swallowing. He continues twice daily Lovenox. He denies bleeding. He reports approximately 5 episodes of diaphoresis and feeling "lightheaded" since undergoing the esophagectomy. This most recently occurred yesterday. There are no associated symptoms including shortness of breath and chest pain. He is alert during these episodes and the symptoms spontaneously resolve over a proximally 15 minutes.   Objective: Blood pressure 110/68, pulse 65, temperature 96.8 F (36 C), temperature source Oral, height 6\' 3"  (1.905 m), weight 193 lb 1.6 oz (87.59 kg).  Oropharynx is without thrush or ulceration. Lungs are clear. No wheezes or rales. Regular cardiac rhythm. Port-A-Cath site is nontender without erythema. Abdomen is soft and nontender. No hepatomegaly. The left lower leg is slightly larger than the right side. No edema. Vibratory sense is intact over the fingertips bilaterally.   Lab Results:  Hemoglobin 11.2 white count 7.7 absolute neutrophil count 5.9 platelet count 132,000.   Medications: I have reviewed the patient's current medications.  Assessment/Plan: 1. 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  A fine-needle aspiration of a hepatoduodenal lymph node confirmed adenocarcinoma.  A staging PET scan 09/10/2010 revealed hypermetabolism corresponding to wall thickening of the distal esophagus with an SUV of 13.8 and 2 hypermetabolic hepatoduodenal ligament lymph nodes.   a. He began concurrent Taxol/carboplatin chemotherapy and radiation 09/24/2010.  Radiation was completed 11/01/2010 and the last chemotherapy was given 10/29/2010.   b. Restaging x-ray evaluation at [facility] 11/21/2010 revealed no evidence  of distant metastatic disease.   c. Status post an esophagogastrectomy 12/24/2010 with pathology showing a 2.5 cm poorly differentiated adenocarcinoma of the distal esophagus with invasion through the full thickness of the muscularis propria and into the paraesophageal soft tissue with negative surgical margins.  No lymphovascular invasion and no perineural invasion were identified.  Metastatic adenocarcinoma was identified in 7/13 regional lymph nodes (yPT3 yPN3).  2. Postoperative left lower extremity deep vein thrombosis, maintained on Lovenox. 3. Nutrition, now maintained a regular diet. 4. Left calf pain 09/20/2010.  A venous Doppler was negative for deep vein thrombosis or superficial thrombosis. 5. Baseline tinnitus of the left ear. 6. Multiple subcutaneous nodular lesions, likely lipomas. 7. History of diverticulitis. 8. History of colonic polyps. 9. Family history of esophagus cancer. 10. Superficial phlebitis of the left arm 10/08/2010. 11. Delayed nausea following cycle #1 of FOLFOX, Aloxi was added with cycle #2. 12.   Neutropenia secondary to chemotherapy, cycle number 2 of FOLFOX was delayed.   Chemotherapy was dose reduced and he received Neulasta beginning with cycle number 2.    Disposition: Mr. Stickels appears stable. Plan to proceed with cycle 6 of FOLFOX today as scheduled. He will again receive Neulasta support. He will return for a followup visit  and cycle 7 FOLFOX in 2 weeks. He will contact the office in the interim with any problems.  The etiology of diaphoresis is unclear. I cannot relate this to the FOLFOX chemotherapy. The most recent episode occurred 2 weeks after chemotherapy. He will contact us if these episodes become more frequent or he develops associated symptoms.  He is scheduled for a restaging evaluation at [facility] next week.   Alfred Delgado ANP/GNP-BC

## 2011-06-05 ENCOUNTER — Ambulatory Visit (HOSPITAL_BASED_OUTPATIENT_CLINIC_OR_DEPARTMENT_OTHER): Payer: Managed Care, Other (non HMO)

## 2011-06-05 ENCOUNTER — Other Ambulatory Visit: Payer: Self-pay | Admitting: Nurse Practitioner

## 2011-06-05 VITALS — BP 106/61 | HR 75 | Temp 97.1°F

## 2011-06-05 DIAGNOSIS — C160 Malignant neoplasm of cardia: Secondary | ICD-10-CM

## 2011-06-05 DIAGNOSIS — C159 Malignant neoplasm of esophagus, unspecified: Secondary | ICD-10-CM

## 2011-06-05 MED ORDER — HEPARIN SOD (PORK) LOCK FLUSH 100 UNIT/ML IV SOLN
500.0000 [IU] | Freq: Once | INTRAVENOUS | Status: AC
  Administered 2011-06-05: 500 [IU] via INTRAVENOUS
  Filled 2011-06-05: qty 5

## 2011-06-05 MED ORDER — SODIUM CHLORIDE 0.9 % IJ SOLN
10.0000 mL | Freq: Once | INTRAMUSCULAR | Status: AC
  Administered 2011-06-05: 10 mL via INTRAVENOUS
  Filled 2011-06-05: qty 10

## 2011-06-05 MED ORDER — PEGFILGRASTIM INJECTION 6 MG/0.6ML
6.0000 mg | Freq: Once | SUBCUTANEOUS | Status: AC
  Administered 2011-06-05: 6 mg via SUBCUTANEOUS

## 2011-06-05 NOTE — Patient Instructions (Signed)
Call MD for problems 

## 2011-06-07 ENCOUNTER — Other Ambulatory Visit: Payer: Self-pay | Admitting: *Deleted

## 2011-06-16 ENCOUNTER — Other Ambulatory Visit: Payer: Self-pay | Admitting: Oncology

## 2011-06-17 ENCOUNTER — Ambulatory Visit (HOSPITAL_BASED_OUTPATIENT_CLINIC_OR_DEPARTMENT_OTHER): Payer: Managed Care, Other (non HMO)

## 2011-06-17 ENCOUNTER — Ambulatory Visit (HOSPITAL_BASED_OUTPATIENT_CLINIC_OR_DEPARTMENT_OTHER): Payer: Managed Care, Other (non HMO) | Admitting: Nurse Practitioner

## 2011-06-17 ENCOUNTER — Other Ambulatory Visit (HOSPITAL_BASED_OUTPATIENT_CLINIC_OR_DEPARTMENT_OTHER): Payer: Managed Care, Other (non HMO) | Admitting: Lab

## 2011-06-17 VITALS — BP 107/64 | HR 79

## 2011-06-17 DIAGNOSIS — C160 Malignant neoplasm of cardia: Secondary | ICD-10-CM

## 2011-06-17 DIAGNOSIS — Z5111 Encounter for antineoplastic chemotherapy: Secondary | ICD-10-CM

## 2011-06-17 DIAGNOSIS — C159 Malignant neoplasm of esophagus, unspecified: Secondary | ICD-10-CM

## 2011-06-17 DIAGNOSIS — C786 Secondary malignant neoplasm of retroperitoneum and peritoneum: Secondary | ICD-10-CM

## 2011-06-17 DIAGNOSIS — I82409 Acute embolism and thrombosis of unspecified deep veins of unspecified lower extremity: Secondary | ICD-10-CM

## 2011-06-17 LAB — CBC WITH DIFFERENTIAL/PLATELET
BASO%: 0.3 % (ref 0.0–2.0)
Basophils Absolute: 0 10*3/uL (ref 0.0–0.1)
EOS%: 1.5 % (ref 0.0–7.0)
HGB: 11.6 g/dL — ABNORMAL LOW (ref 13.0–17.1)
MCH: 32.7 pg (ref 27.2–33.4)
MCHC: 34.4 g/dL (ref 32.0–36.0)
MCV: 95 fL (ref 79.3–98.0)
MONO%: 2.2 % (ref 0.0–14.0)
RDW: 23.1 % — ABNORMAL HIGH (ref 11.0–14.6)
lymph#: 0.5 10*3/uL — ABNORMAL LOW (ref 0.9–3.3)

## 2011-06-17 LAB — COMPREHENSIVE METABOLIC PANEL
CO2: 26 mEq/L (ref 19–32)
Calcium: 9.3 mg/dL (ref 8.4–10.5)
Chloride: 102 mEq/L (ref 96–112)
Glucose, Bld: 106 mg/dL — ABNORMAL HIGH (ref 70–99)
Potassium: 3.4 mEq/L — ABNORMAL LOW (ref 3.5–5.3)
Total Bilirubin: 0.2 mg/dL — ABNORMAL LOW (ref 0.3–1.2)
Total Protein: 6.5 g/dL (ref 6.0–8.3)

## 2011-06-17 MED ORDER — FLUOROURACIL CHEMO INJECTION 2.5 GM/50ML
300.0000 mg/m2 | Freq: Once | INTRAVENOUS | Status: AC
  Administered 2011-06-17: 650 mg via INTRAVENOUS
  Filled 2011-06-17: qty 13

## 2011-06-17 MED ORDER — ONDANSETRON 8 MG/50ML IVPB (CHCC)
8.0000 mg | Freq: Once | INTRAVENOUS | Status: AC
  Administered 2011-06-17: 8 mg via INTRAVENOUS

## 2011-06-17 MED ORDER — DEXAMETHASONE SODIUM PHOSPHATE 10 MG/ML IJ SOLN
10.0000 mg | Freq: Once | INTRAMUSCULAR | Status: AC
  Administered 2011-06-17: 10 mg via INTRAVENOUS

## 2011-06-17 MED ORDER — DEXTROSE 5 % IV SOLN
Freq: Once | INTRAVENOUS | Status: AC
  Administered 2011-06-17: 09:00:00 via INTRAVENOUS

## 2011-06-17 MED ORDER — SODIUM CHLORIDE 0.9 % IJ SOLN
10.0000 mL | INTRAMUSCULAR | Status: DC | PRN
  Filled 2011-06-17: qty 10

## 2011-06-17 MED ORDER — HEPARIN SOD (PORK) LOCK FLUSH 100 UNIT/ML IV SOLN
500.0000 [IU] | Freq: Once | INTRAVENOUS | Status: DC | PRN
  Filled 2011-06-17: qty 5

## 2011-06-17 MED ORDER — OXALIPLATIN CHEMO INJECTION 100 MG/20ML
62.0000 mg/m2 | Freq: Once | INTRAVENOUS | Status: AC
  Administered 2011-06-17: 140 mg via INTRAVENOUS
  Filled 2011-06-17: qty 28

## 2011-06-17 MED ORDER — SODIUM CHLORIDE 0.9 % IV SOLN
1750.0000 mg/m2 | INTRAVENOUS | Status: DC
  Administered 2011-06-17: 3900 mg via INTRAVENOUS
  Filled 2011-06-17: qty 78

## 2011-06-17 MED ORDER — LEUCOVORIN CALCIUM INJECTION 350 MG
286.0000 mg/m2 | Freq: Once | INTRAVENOUS | Status: AC
  Administered 2011-06-17: 640 mg via INTRAVENOUS
  Filled 2011-06-17: qty 32

## 2011-06-17 NOTE — Progress Notes (Signed)
OFFICE PROGRESS NOTE  Interval history:  [name] returns as scheduled. He completed cycle #6 FOLFOX chemotherapy 06/03/2011. He denies nausea/vomiting. No diarrhea. No mouth sores. He had cold sensitivity for approximately one to 2 days. He denies persistent neuropathy symptoms.   Objective: There were no vitals taken for this visit.  Oropharynx is without thrush or ulceration. Mucous membranes are pink and moist. Lungs clear. No wheezes or rales. Regular cardiac rhythm. Port-A-Cath site is without erythema. Abdomen is soft and nontender. Trace lower leg pretibial edema bilaterally left slightly greater than right. Vibratory sense intact over the fingertips per tuning fork exam.   Lab Results: Lab Results  Component Value Date   WBC 6.0 06/17/2011   HGB 11.6* 06/17/2011   HCT 33.7* 06/17/2011   MCV 95.0 06/17/2011   PLT 130* 06/17/2011    Chemistry:      Component Value Date/Time   NA 137 06/17/2011 0809   K 3.4* 06/17/2011 0809   CL 102 06/17/2011 0809   CO2 26 06/17/2011 0809   GLUCOSE 106* 06/17/2011 0809   BUN 9 06/17/2011 0809   CREATININE 0.88 06/17/2011 0809   CALCIUM 9.3 06/17/2011 0809   PROT 6.5 06/17/2011 0809   ALBUMIN 3.8 06/17/2011 0809   AST 22 06/17/2011 0809   ALT 19 06/17/2011 0809   ALKPHOS 137* 06/17/2011 0809   BILITOT 0.2* 06/17/2011 0809   GFRNONAA 103.60 06/25/2010 1011     Studies/Results: No results found.  Medications: I have reviewed the patient's current medications.  Assessment/Plan:  1. 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 A fine-needle aspiration of a hepatoduodenal lymph node confirmed adenocarcinoma. A staging PET scan 09/10/2010 revealed hypermetabolism corresponding to wall thickening of the distal esophagus with an SUV of 13.8 and 2 hypermetabolic hepatoduodenal ligament lymph nodes.  a. He began concurrent Taxol/carboplatin chemotherapy and radiation 09/24/2010. Radiation was completed 11/01/2010 and  the last chemotherapy was given 10/29/2010.  b. Restaging x-ray evaluation at [facility] 11/21/2010 revealed no evidence of distant metastatic disease.  c. Status post an esophagogastrectomy 12/24/2010 with pathology showing a 2.5 cm poorly differentiated adenocarcinoma of the distal esophagus with invasion through the full thickness of the muscularis propria and into the paraesophageal soft tissue with negative surgical margins. No lymphovascular invasion and no perineural invasion were identified. Metastatic adenocarcinoma was identified in 7/13 regional lymph nodes (yPT3 yPN3).  d. He began adjuvant FOLFOX chemotherapy on 03/14/2011. He has completed 6 cycles. e. Restaging chest CT done 06/12/2011 at [facility] showed postsurgical changes and no evidence of local recurrence or metastatic disease to the chest. 2. Postoperative left lower extremity deep vein thrombosis, maintained on Lovenox. Per the office note from Mr. Huberty visit with Dr. Glenna Durand on 06/12/2011 he will discontinue Lovenox after he completes the current prescription. 3. Nutrition, now maintained a regular diet. 4. Left calf pain 09/20/2010. A venous Doppler was negative for deep vein thrombosis or superficial thrombosis. 5. Baseline tinnitus of the left ear. 6. Multiple subcutaneous nodular lesions, likely lipomas. 7. History of diverticulitis. 8. History of colonic polyps. 9. Family history of esophagus cancer. 10. Superficial phlebitis of the left arm 10/08/2010. 11. Delayed nausea following cycle #1 of FOLFOX, Aloxi was added with cycle #2.       12. Neutropenia secondary to chemotherapy, cycle number 2 of FOLFOX was delayed. Chemotherapy was dose reduced             and he received Neulasta beginning with cycle number 2.   Disposition-[name] appears stable.  Plan to proceed with cycle #7 of FOLFOX today as scheduled. He will return for a followup visit and cycle 8 FOLFOX 07/01/2011. He will contact the office in the interim with any  problems.  Plan reviewed with Dr. Truett Perna.    Lonna Cobb ANP/GNP-BC

## 2011-06-17 NOTE — Patient Instructions (Signed)
Patient discharged home with wife; pump started with no problems; patient to have pump d/c'ed 06/19/11 @ 1100; patient has medication for nausea and pain.

## 2011-06-19 ENCOUNTER — Ambulatory Visit (HOSPITAL_BASED_OUTPATIENT_CLINIC_OR_DEPARTMENT_OTHER): Payer: Managed Care, Other (non HMO)

## 2011-06-19 VITALS — BP 101/64 | HR 68 | Temp 97.1°F

## 2011-06-19 DIAGNOSIS — C159 Malignant neoplasm of esophagus, unspecified: Secondary | ICD-10-CM

## 2011-06-19 DIAGNOSIS — C160 Malignant neoplasm of cardia: Secondary | ICD-10-CM

## 2011-06-19 MED ORDER — HEPARIN SOD (PORK) LOCK FLUSH 100 UNIT/ML IV SOLN
500.0000 [IU] | Freq: Once | INTRAVENOUS | Status: AC | PRN
  Administered 2011-06-19: 500 [IU]
  Filled 2011-06-19: qty 5

## 2011-06-19 MED ORDER — SODIUM CHLORIDE 0.9 % IJ SOLN
10.0000 mL | INTRAMUSCULAR | Status: DC | PRN
  Administered 2011-06-19: 10 mL
  Filled 2011-06-19: qty 10

## 2011-06-19 MED ORDER — PEGFILGRASTIM INJECTION 6 MG/0.6ML
6.0000 mg | Freq: Once | SUBCUTANEOUS | Status: AC
  Administered 2011-06-19: 6 mg via SUBCUTANEOUS
  Filled 2011-06-19: qty 0.6

## 2011-06-19 NOTE — Patient Instructions (Signed)
Call md for problems 

## 2011-06-28 ENCOUNTER — Encounter: Payer: Self-pay | Admitting: *Deleted

## 2011-07-01 ENCOUNTER — Ambulatory Visit (HOSPITAL_BASED_OUTPATIENT_CLINIC_OR_DEPARTMENT_OTHER): Payer: Managed Care, Other (non HMO)

## 2011-07-01 ENCOUNTER — Telehealth: Payer: Self-pay | Admitting: Oncology

## 2011-07-01 ENCOUNTER — Other Ambulatory Visit (HOSPITAL_BASED_OUTPATIENT_CLINIC_OR_DEPARTMENT_OTHER): Payer: Managed Care, Other (non HMO) | Admitting: Lab

## 2011-07-01 ENCOUNTER — Ambulatory Visit (HOSPITAL_BASED_OUTPATIENT_CLINIC_OR_DEPARTMENT_OTHER): Payer: Managed Care, Other (non HMO) | Admitting: Oncology

## 2011-07-01 VITALS — BP 102/61 | HR 80 | Temp 97.0°F | Ht 75.0 in | Wt 188.2 lb

## 2011-07-01 DIAGNOSIS — C160 Malignant neoplasm of cardia: Secondary | ICD-10-CM

## 2011-07-01 DIAGNOSIS — Z5111 Encounter for antineoplastic chemotherapy: Secondary | ICD-10-CM

## 2011-07-01 DIAGNOSIS — C159 Malignant neoplasm of esophagus, unspecified: Secondary | ICD-10-CM

## 2011-07-01 DIAGNOSIS — C786 Secondary malignant neoplasm of retroperitoneum and peritoneum: Secondary | ICD-10-CM

## 2011-07-01 DIAGNOSIS — D708 Other neutropenia: Secondary | ICD-10-CM

## 2011-07-01 LAB — COMPREHENSIVE METABOLIC PANEL
ALT: 14 U/L (ref 0–53)
AST: 23 U/L (ref 0–37)
CO2: 28 mEq/L (ref 19–32)
Chloride: 102 mEq/L (ref 96–112)
Creatinine, Ser: 0.84 mg/dL (ref 0.50–1.35)
Sodium: 137 mEq/L (ref 135–145)
Total Bilirubin: 0.3 mg/dL (ref 0.3–1.2)
Total Protein: 6.7 g/dL (ref 6.0–8.3)

## 2011-07-01 LAB — CBC WITH DIFFERENTIAL/PLATELET
BASO%: 1 % (ref 0.0–2.0)
EOS%: 1.8 % (ref 0.0–7.0)
HCT: 34.7 % — ABNORMAL LOW (ref 38.4–49.9)
LYMPH%: 9.9 % — ABNORMAL LOW (ref 14.0–49.0)
MCH: 32.2 pg (ref 27.2–33.4)
MCHC: 34 g/dL (ref 32.0–36.0)
MCV: 94.8 fL (ref 79.3–98.0)
MONO#: 0.9 10*3/uL (ref 0.1–0.9)
MONO%: 10.2 % (ref 0.0–14.0)
NEUT%: 77.1 % — ABNORMAL HIGH (ref 39.0–75.0)
Platelets: 145 10*3/uL (ref 140–400)
RBC: 3.66 10*6/uL — ABNORMAL LOW (ref 4.20–5.82)
WBC: 8.4 10*3/uL (ref 4.0–10.3)
nRBC: 0 % (ref 0–0)

## 2011-07-01 MED ORDER — SODIUM CHLORIDE 0.9 % IV SOLN
1750.0000 mg/m2 | INTRAVENOUS | Status: DC
  Administered 2011-07-01: 3900 mg via INTRAVENOUS
  Filled 2011-07-01: qty 78

## 2011-07-01 MED ORDER — FLUOROURACIL CHEMO INJECTION 2.5 GM/50ML
300.0000 mg/m2 | Freq: Once | INTRAVENOUS | Status: AC
  Administered 2011-07-01: 650 mg via INTRAVENOUS
  Filled 2011-07-01: qty 13

## 2011-07-01 MED ORDER — ONDANSETRON 8 MG/50ML IVPB (CHCC)
8.0000 mg | Freq: Once | INTRAVENOUS | Status: AC
  Administered 2011-07-01: 8 mg via INTRAVENOUS

## 2011-07-01 MED ORDER — DEXAMETHASONE SODIUM PHOSPHATE 10 MG/ML IJ SOLN
10.0000 mg | Freq: Once | INTRAMUSCULAR | Status: AC
  Administered 2011-07-01: 10 mg via INTRAVENOUS

## 2011-07-01 MED ORDER — DEXTROSE 5 % IV SOLN
Freq: Once | INTRAVENOUS | Status: AC
  Administered 2011-07-01: 10:00:00 via INTRAVENOUS

## 2011-07-01 MED ORDER — DEXTROSE 5 % IV SOLN
62.0000 mg/m2 | Freq: Once | INTRAVENOUS | Status: AC
  Administered 2011-07-01: 140 mg via INTRAVENOUS
  Filled 2011-07-01: qty 28

## 2011-07-01 MED ORDER — LEUCOVORIN CALCIUM INJECTION 350 MG
286.0000 mg/m2 | Freq: Once | INTRAVENOUS | Status: AC
  Administered 2011-07-01: 640 mg via INTRAVENOUS
  Filled 2011-07-01: qty 32

## 2011-07-01 NOTE — Telephone Encounter (Signed)
gve the pt's wife the march 2013 appt calendar

## 2011-07-01 NOTE — Progress Notes (Signed)
OFFICE PROGRESS NOTE   INTERVAL HISTORY:   He returns as scheduled. He completed another cycle of FOLFOX on December 10. He reports more fatigue following the most recent cycle of chemotherapy. He denies nausea/vomiting, mouth sores, and diarrhea. Cold sensitivity following chemotherapy has resolved. He he developed mild numbness at the tip of the right index finger after repetitive use at work. This has almost completely resolved. He has no other numbness or tingling.  He underwent a restaging evaluation at [facility] earlier this month. The Lovenox was discontinued.  Objective:  Vital signs in last 24 hours:  Blood pressure 102/61, pulse 80, temperature 97 F (36.1 C), temperature source Oral, height 6\' 3"  (1.905 m), weight 188 lb 3.2 oz (85.367 kg).    HEENT: No thrush or ulcers Lymphatics: No cervical or clavicular nodes. "Shoddy "right greater than left axillary nodes. Resp: Lungs clear bilaterally. Cardio: Regular rate and rhythm GI: No hepatomegaly. No mass Vascular: No leg edema Neuro: The vibratory sense is intact at the fingertip bilaterally    Portacath/PICC-without erythema  Lab Results:  Lab Results  Component Value Date   WBC 8.4 07/01/2011   HGB 11.8* 07/01/2011   HCT 34.7* 07/01/2011   MCV 94.8 07/01/2011   PLT 145 07/01/2011   ANC 6.5   Medications: I have reviewed the patient's current medications.  Assessment/Plan: 1. 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 A fine-needle aspiration of a hepatoduodenal lymph node confirmed adenocarcinoma. A staging PET scan 09/10/2010 revealed hypermetabolism corresponding to wall thickening of the distal esophagus with an SUV of 13.8 and 2 hypermetabolic hepatoduodenal ligament lymph nodes.  a. He began concurrent Taxol/carboplatin chemotherapy and radiation 09/24/2010. Radiation was completed 11/01/2010 and the last chemotherapy was given 10/29/2010.  b. Restaging x-ray evaluation at [facility] 11/21/2010  revealed no evidence of distant metastatic disease.  c. Status post an esophagogastrectomy 12/24/2010 with pathology showing a 2.5 cm poorly differentiated adenocarcinoma of the distal esophagus with invasion through the full thickness of the muscularis propria and into the paraesophageal soft tissue with negative surgical margins. No lymphovascular invasion and no perineural invasion were identified. Metastatic adenocarcinoma was identified in 7/13 regional lymph nodes (yPT3 yPN3).  d. He began adjuvant FOLFOX chemotherapy on 03/14/2011. He has completed 6 cycles. e. Restaging chest CT done 06/12/2011 at [facility] showed postsurgical changes and no evidence of local recurrence or metastatic disease to the chest. 2. Postoperative left lower extremity deep vein thrombosis, maintained on Lovenox. He discontinued Lovenox after an office visit with Dr.Tong on 06/12/2011 3. Nutrition, now maintained a regular diet. 4. Left calf pain 09/20/2010. A venous Doppler was negative for deep vein thrombosis or superficial thrombosis. 5. Baseline tinnitus of the left ear. 6. Multiple subcutaneous nodular lesions, likely lipomas. 7. History of diverticulitis. 8. History of colonic polyps. 9. Family history of esophagus cancer. 10. Superficial phlebitis of the left arm 10/08/2010. 11. Delayed nausea following cycle #1 of FOLFOX, Aloxi was added with cycle #2. 12. Neutropenia secondary to chemotherapy, cycle number 2 of FOLFOX was delayed. Chemotherapy was dose reduced and he received Neulasta beginning with cycle number 2.     Disposition:  He will complete the eighth and final planned cycle of adjuvant chemotherapy today. He tolerated the chemotherapy with out significant acute toxicity. We will schedule removal of the Port-A-Cath for later this week. He will return for an office visit in 2-3 months.   Lucile Shutters, MD  07/01/2011  12:37 PM

## 2011-07-03 ENCOUNTER — Ambulatory Visit (HOSPITAL_BASED_OUTPATIENT_CLINIC_OR_DEPARTMENT_OTHER): Payer: Managed Care, Other (non HMO)

## 2011-07-03 VITALS — BP 110/60 | HR 76 | Temp 97.2°F

## 2011-07-03 DIAGNOSIS — D708 Other neutropenia: Secondary | ICD-10-CM

## 2011-07-03 DIAGNOSIS — C159 Malignant neoplasm of esophagus, unspecified: Secondary | ICD-10-CM

## 2011-07-03 MED ORDER — SODIUM CHLORIDE 0.9 % IJ SOLN
10.0000 mL | INTRAMUSCULAR | Status: DC | PRN
  Administered 2011-07-03: 10 mL
  Filled 2011-07-03: qty 10

## 2011-07-03 MED ORDER — PEGFILGRASTIM INJECTION 6 MG/0.6ML
6.0000 mg | Freq: Once | SUBCUTANEOUS | Status: AC
  Administered 2011-07-03: 6 mg via SUBCUTANEOUS
  Filled 2011-07-03: qty 0.6

## 2011-07-03 MED ORDER — HEPARIN SOD (PORK) LOCK FLUSH 100 UNIT/ML IV SOLN
500.0000 [IU] | Freq: Once | INTRAVENOUS | Status: AC | PRN
  Administered 2011-07-03: 500 [IU]
  Filled 2011-07-03: qty 5

## 2011-07-05 ENCOUNTER — Other Ambulatory Visit: Payer: Self-pay | Admitting: Certified Registered Nurse Anesthetist

## 2011-07-05 ENCOUNTER — Other Ambulatory Visit: Payer: Self-pay | Admitting: Radiology

## 2011-07-05 ENCOUNTER — Encounter (HOSPITAL_COMMUNITY): Payer: Self-pay | Admitting: Pharmacy Technician

## 2011-07-08 ENCOUNTER — Encounter (HOSPITAL_COMMUNITY): Payer: Self-pay

## 2011-07-08 ENCOUNTER — Ambulatory Visit (HOSPITAL_COMMUNITY)
Admission: RE | Admit: 2011-07-08 | Discharge: 2011-07-08 | Disposition: A | Discharge status: Home / Self Care | Payer: Managed Care, Other (non HMO) | Source: Ambulatory Visit | Attending: Oncology | Admitting: Oncology

## 2011-07-08 DIAGNOSIS — Z452 Encounter for adjustment and management of vascular access device: Secondary | ICD-10-CM | POA: Insufficient documentation

## 2011-07-08 DIAGNOSIS — Z86718 Personal history of other venous thrombosis and embolism: Secondary | ICD-10-CM | POA: Insufficient documentation

## 2011-07-08 DIAGNOSIS — Z79899 Other long term (current) drug therapy: Secondary | ICD-10-CM | POA: Insufficient documentation

## 2011-07-08 DIAGNOSIS — C159 Malignant neoplasm of esophagus, unspecified: Secondary | ICD-10-CM | POA: Insufficient documentation

## 2011-07-08 DIAGNOSIS — J45909 Unspecified asthma, uncomplicated: Secondary | ICD-10-CM | POA: Insufficient documentation

## 2011-07-08 MED ORDER — SODIUM CHLORIDE 0.9 % IV SOLN: INTRAVENOUS | Status: DC

## 2011-07-08 MED ORDER — FENTANYL CITRATE 0.05 MG/ML IJ SOLN: 100.0000 ug | Freq: Once | INTRAMUSCULAR | Status: DC

## 2011-07-08 MED ORDER — FENTANYL CITRATE 0.05 MG/ML IJ SOLN
INTRAMUSCULAR | Status: AC
  Administered 2011-07-08: 09:00:00 via INTRAMUSCULAR
  Filled 2011-07-08: qty 2

## 2011-07-08 MED ORDER — CEFAZOLIN SODIUM 1-5 GM-% IV SOLN
1.0000 g | INTRAVENOUS | Status: AC
  Administered 2011-07-08: 1 g via INTRAVENOUS
  Filled 2011-07-08: qty 50

## 2011-07-08 NOTE — H&P (Signed)
Agree 

## 2011-07-08 NOTE — H&P (Signed)
Alfred Delgado is an 50 y.o. male.   Chief Complaint: Esophageal CA; chemo therapy complete HPI: scheduled for Port-A-Cath removal today  Past Medical History  Diagnosis Date  . NEOPLASM, MALIGNANT, ESOPHAGUS 08/30/2010  . Asthma   . History of colon polyps   . Multiple lipomas     History of  . DVT of lower extremity (deep venous thrombosis)   . History of colonic diverticulitis     Past Surgical History  Procedure Date  . Portacath placement 03/14/2011  . Tonsillectomy   . Esophagogastrectomy 2012    Dr. Arbie Delgado    History reviewed. No pertinent family history. Social History:  does not have a smoking history on file. He does not have any smokeless tobacco history on file. His alcohol and drug histories not on file.  Allergies: No Known Allergies  Medications Prior to Admission  Medication Sig Dispense Refill  . albuterol (PROVENTIL HFA;VENTOLIN HFA) 108 (90 BASE) MCG/ACT inhaler Inhale 1 puff into the lungs every 4 (four) hours as needed. Wheezing       . Cats Claw, Uncaria tomentosa, (CATS CLAW PO) Take 2 capsules by mouth daily.        . Digestive Enzymes CAPS Take 1 capsule by mouth daily.        [name] Fe Bisgly-Vit C-Vit B12-FA (GENTLE IRON PO) Take 1 tablet by mouth daily.        Alfred Delgado Mushroom POWD Take 2 capsules by mouth daily.        . Probiotic Product (PROBIOTIC FORMULA PO) Take 2 capsules by mouth daily.        . prochlorperazine (COMPAZINE) 10 MG tablet Take 10 mg by mouth every 6 (six) hours as needed. Nausea       . sodium chloride (OCEAN) 0.65 % nasal spray Place 2 sprays into the nose 2 (two) times daily as needed. Allergies        . vitamin B-12 (CYANOCOBALAMIN) 1000 MCG tablet Take 1,000 mcg by mouth daily.         Medications Prior to Admission  Medication Dose Route Frequency Provider Last Rate Last Dose  . 0.9 %  sodium chloride infusion   Intravenous Continuous Alfred Leu, PA      . ceFAZolin (ANCEF) IVPB 1 g/50 mL premix  1 g Intravenous  to XRAY Alfred Leu, PA        No results found for this or any previous visit (from the past 48 hour(s)). No results found.  Review of Systems  Constitutional: Negative for fever.  Respiratory: Negative for cough.   Cardiovascular: Negative for chest pain.  Gastrointestinal: Negative for nausea, vomiting and abdominal pain.  Neurological: Negative for headaches.    Blood pressure 109/66, pulse 71, temperature 97.6 F (36.4 C), resp. rate 21. Physical Exam  Constitutional: He is oriented to person, place, and time. He appears well-developed and well-nourished.  HENT:  Head: Normocephalic.  Eyes: EOM are normal.  Neck: Normal range of motion.  Cardiovascular: Normal rate, regular rhythm and normal heart sounds.   No murmur heard. Respiratory: Effort normal and breath sounds normal. He has no wheezes.  GI: Soft. Bowel sounds are normal. He exhibits no mass.  Musculoskeletal: Normal range of motion.  Neurological: He is alert and oriented to person, place, and time.  Skin: Skin is warm and dry.     Assessment/Plan Pt with Hx Esophageal Ca; chemo completed 12/24 Scheduled now for removal of PAC. Understands procedure risks and benefits  and agreeable to proceed. Consent signed.  Alfred Delgado A 07/08/2011, 8:03 AM

## 2011-07-08 NOTE — Procedures (Signed)
Procedure:  Port removal Findings:  Right chest port removed w/ sharp and blunt dissection.

## 2011-09-12 ENCOUNTER — Telehealth: Payer: Self-pay | Admitting: *Deleted

## 2011-09-12 NOTE — Telephone Encounter (Signed)
Call from pt reporting he is having numbness and tingling in fingers of Bilateral hands. Intermittent numbness/ tingling in feet.  Has noticed this for about one month. Notices tingling in feet when he bends his head or positions his neck a certain way. Pt denies any difficulty with ambulation/ does not interfere with ADLs or work functions. Denies any neck or back pain. Saw chiropractor for this and just wanted to make [facility] and Dr. Truett Perna aware. Pt is scheduled to be seen in this office 09/26/11. He understands to call office sooner if symptoms worsen.

## 2011-09-26 ENCOUNTER — Ambulatory Visit (HOSPITAL_BASED_OUTPATIENT_CLINIC_OR_DEPARTMENT_OTHER): Payer: Managed Care, Other (non HMO) | Admitting: Nurse Practitioner

## 2011-09-26 ENCOUNTER — Telehealth: Payer: Self-pay | Admitting: Oncology

## 2011-09-26 VITALS — BP 101/56 | HR 63 | Temp 97.1°F | Ht 75.0 in | Wt 190.9 lb

## 2011-09-26 DIAGNOSIS — C159 Malignant neoplasm of esophagus, unspecified: Secondary | ICD-10-CM

## 2011-09-26 DIAGNOSIS — R209 Unspecified disturbances of skin sensation: Secondary | ICD-10-CM

## 2011-09-26 DIAGNOSIS — C160 Malignant neoplasm of cardia: Secondary | ICD-10-CM

## 2011-09-26 DIAGNOSIS — Z79899 Other long term (current) drug therapy: Secondary | ICD-10-CM

## 2011-09-26 DIAGNOSIS — R339 Retention of urine, unspecified: Secondary | ICD-10-CM

## 2011-09-26 NOTE — Progress Notes (Signed)
OFFICE PROGRESS NOTE  Interval history:  Alfred Delgado returns as scheduled. Overall he feels well. He has a good appetite and good energy level. He denies pain. No nausea or vomiting. Bowels regularly. He denies shortness of breath.  Over the past one month he has intermittently felt that not emptying his bladder completely with voiding. He denies dysuria, frequency and urgency.  Over the past few weeks he has intermittently noted a tingling sensation in his feet and hands. With neck flexion he feels a tingling sensation at the lower legs and feet bilaterally. He denies neck pain. No extremity weakness.   Objective: Blood pressure 101/56, pulse 63, temperature 97.1 F (36.2 C), temperature source Oral, height 6\' 3"  (1.905 m), weight 190 lb 14.4 oz (86.592 kg).  Oropharynx is without thrush or ulceration. No palpable cervical, supraclavicular or axillary lymph nodes. Lungs are clear. No wheezes or rales. Regular cardiac rhythm. Abdomen is soft and nontender. No hepatomegaly. Trace bilateral pretibial edema left greater than right. Nontender over the neck and upper back.  Lab Results: Lab Results  Component Value Date   WBC 8.4 07/01/2011   HGB 11.8* 07/01/2011   HCT 34.7* 07/01/2011   MCV 94.8 07/01/2011   PLT 145 07/01/2011    Chemistry:    Chemistry      Component Value Date/Time   NA 137 07/01/2011 0909   K 4.0 07/01/2011 0909   CL 102 07/01/2011 0909   CO2 28 07/01/2011 0909   BUN 12 07/01/2011 0909   CREATININE 0.84 07/01/2011 0909      Component Value Date/Time   CALCIUM 9.4 07/01/2011 0909   ALKPHOS 157* 07/01/2011 0909   AST 23 07/01/2011 0909   ALT 14 07/01/2011 0909   BILITOT 0.3 07/01/2011 0909       Studies/Results: No results found.  Medications: I have reviewed the patient's current medications.  Assessment/Plan:  1. 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 A fine-needle aspiration of a hepatoduodenal lymph node confirmed  adenocarcinoma. A staging PET scan 09/10/2010 revealed hypermetabolism corresponding to wall thickening of the distal esophagus with an SUV of 13.8 and 2 hypermetabolic hepatoduodenal ligament lymph nodes.  a. He began concurrent Taxol/carboplatin chemotherapy and radiation 09/24/2010. Radiation was completed 11/01/2010 and the last chemotherapy was given 10/29/2010.  b. Restaging x-ray evaluation at [facility] 11/21/2010 revealed no evidence of distant metastatic disease.  c. Status post an esophagogastrectomy 12/24/2010 with pathology showing a 2.5 cm poorly differentiated adenocarcinoma of the distal esophagus with invasion through the full thickness of the muscularis propria and into the paraesophageal soft tissue with negative surgical margins. No lymphovascular invasion and no perineural invasion were identified. Metastatic adenocarcinoma was identified in 7/13 regional lymph nodes (yPT3 yPN3).  d. He began adjuvant FOLFOX chemotherapy on 03/14/2011. He has completed 8 cycles. Cycle 8 was given 07/01/2011. e. Restaging chest CT done 06/12/2011 at [facility] showed postsurgical changes and no evidence of local recurrence or metastatic disease to the chest. 2. Postoperative left lower extremity deep vein thrombosis. He discontinued Lovenox after an office visit with Dr.Tong on 06/12/2011 3. Nutrition, now maintained a regular diet. 4. Left calf pain 09/20/2010. A venous Doppler was negative for deep vein thrombosis or superficial thrombosis. 5. Baseline tinnitus of the left ear. 6. Multiple subcutaneous nodular lesions, likely lipomas. 7. History of diverticulitis. 8. History of colonic polyps. 9. Family history of esophagus cancer. 10. Superficial phlebitis of the left arm 10/08/2010. 11. Delayed nausea following cycle #1 of FOLFOX, Aloxi was added with  cycle #2. 12. Neutropenia secondary to chemotherapy. Cycle #2 of FOLFOX was delayed. Chemotherapy was dose reduced and he received Neulasta beginning with  cycle #2. 13. Intermittent tingling in the hands and feet, tingling at the lower legs/feet with neck flexion. Question if related to previous oxaliplatin. The tingling at the lower legs/feet with neck flexion could also be related to previous radiation. We instructed him to contact the office if the current symptoms worsen or he develops any new symptoms. 14. Urinary symptoms. He will discuss further with his primary care provider. He will contact our office in the interim if he develops any difficulty passing urine or other urinary symptoms.  Disposition-Mr. Keough appears stable. He is scheduled for restaging CT scans at [facility] in June of this year. He will request a copy of the report be forwarded to our office. He will return for a followup visit with Dr. Truett Perna in 4 months. He will contact the office in the interim as outlined above or with any other problems.  Plan reviewed with Dr. Truett Perna.  Lonna Cobb ANP/GNP-BC

## 2011-09-26 NOTE — Telephone Encounter (Signed)
appt made and printed for pt aom °

## 2011-11-11 IMAGING — CT CT HEAD WO/W CM
1 of 3 series · 13 of 30 positions shown, 17 images · IV contrast (agent unspecified)
Comparison: None.

CLINICAL DATA: 49-year-old male with new diagnosis of esophageal
cancer.

CT HEAD WITHOUT AND WITH CONTRAST
TECHNIQUE: Contiguous axial images were obtained from the base of
the skull through the vertex without and with intravenous contrast.
Contrast: 100 ml [id number].

[Series 4: head w/cm 4.8 h45s · axial · 0.43mm/px · z∈[-120,+27]mm · 13 of 36 slices shown, 17 images]
[im 3/36  brain]
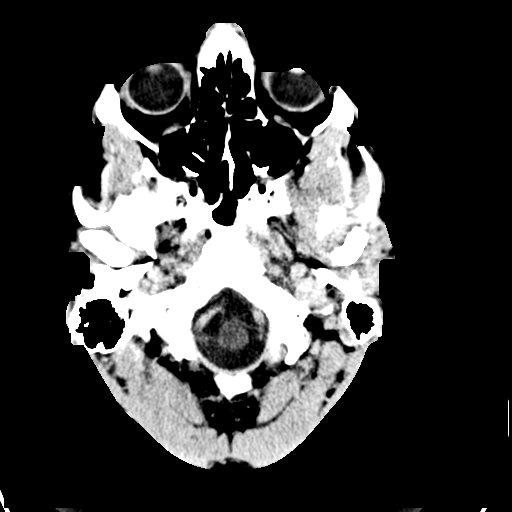
[im 3/36  bone]
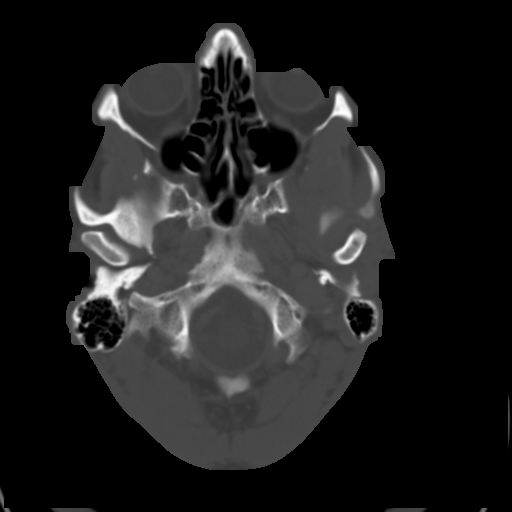
[im 6/36  brain]
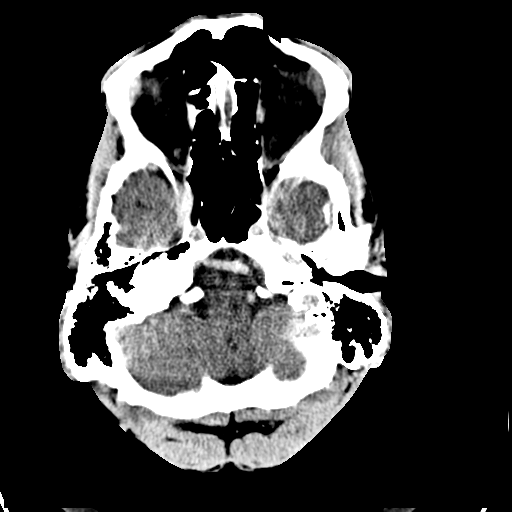
[im 8/36  brain]
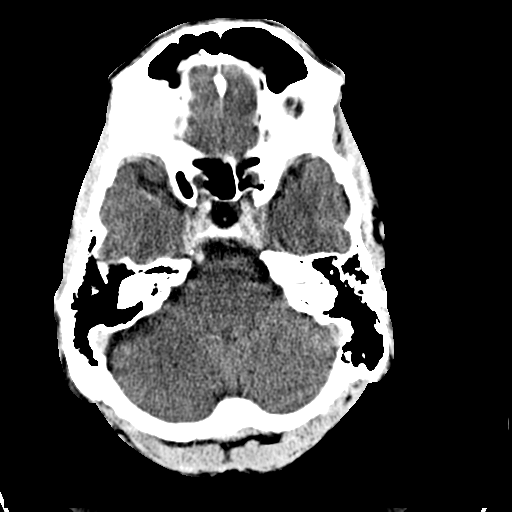
[im 11/36  brain]
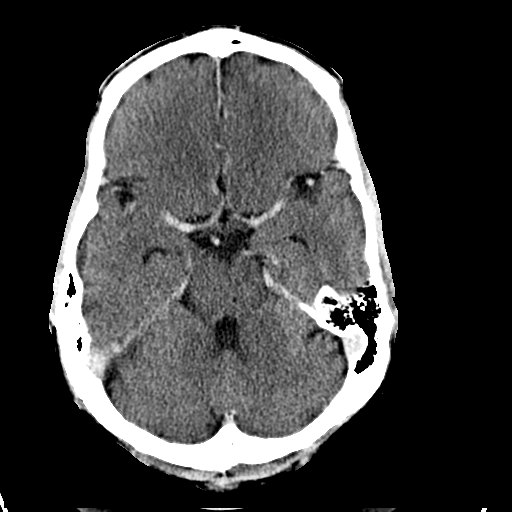
[im 13/36  brain]
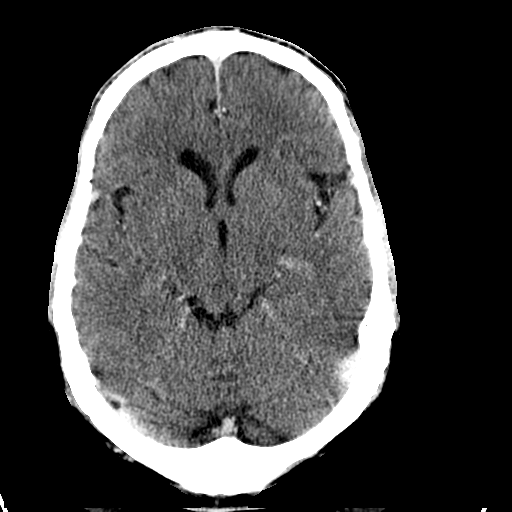
[im 13/36  bone]
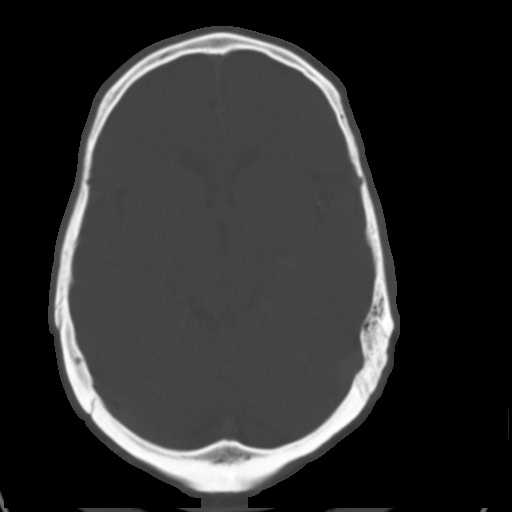
[im 16/36  brain]
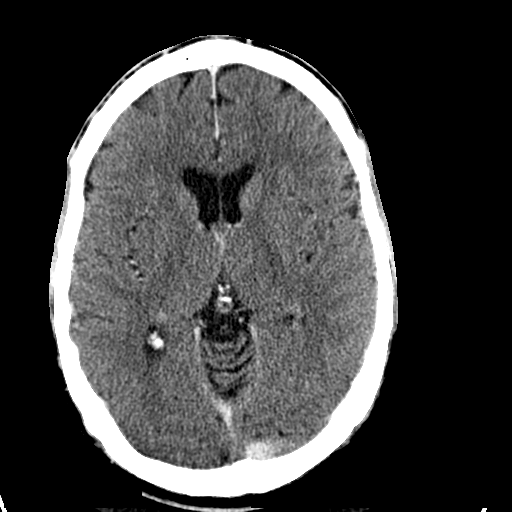
[im 18/36  brain]
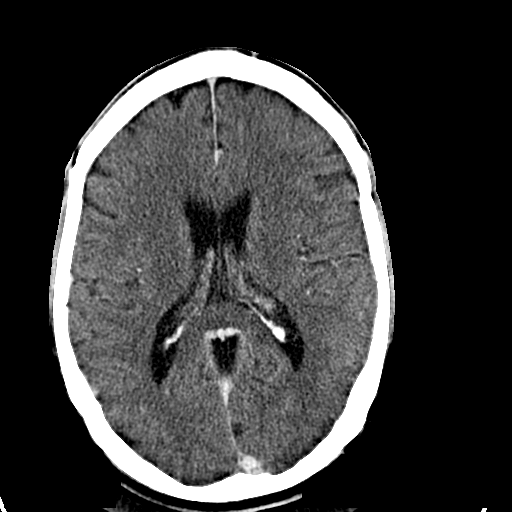
[im 21/36  brain]
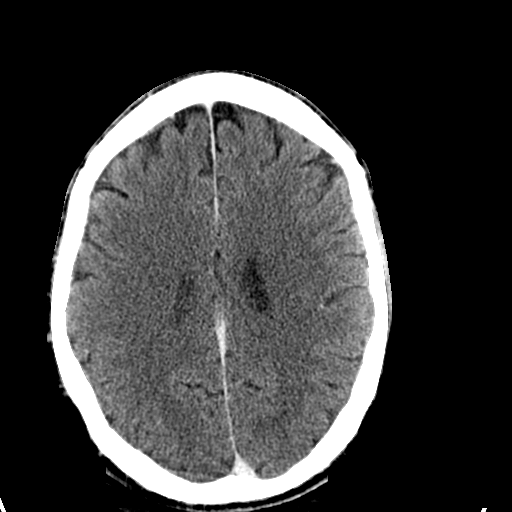
[im 23/36  brain]
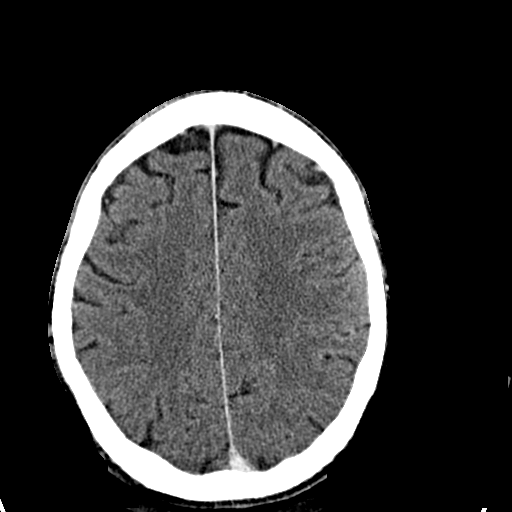
[im 23/36  bone]
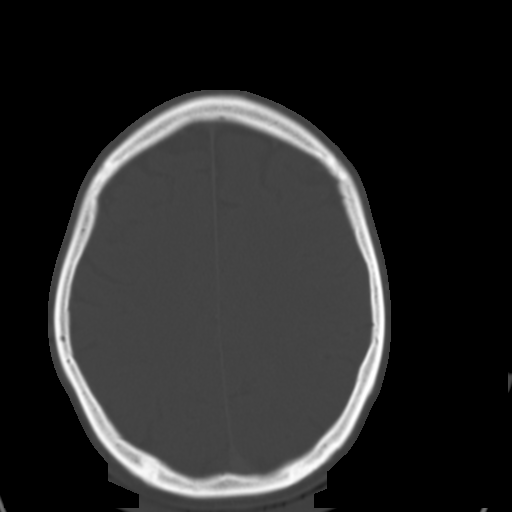
[im 26/36  brain]
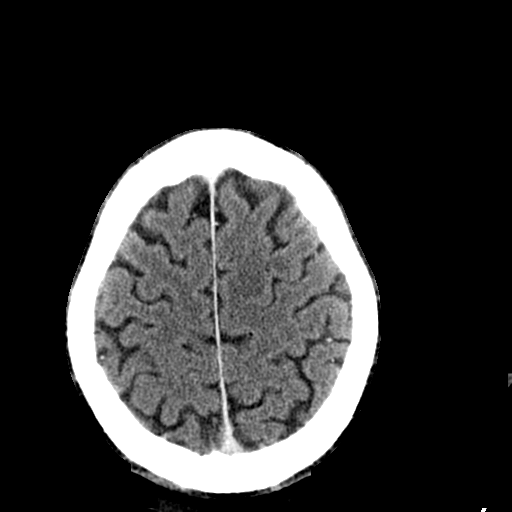
[im 28/36  brain]
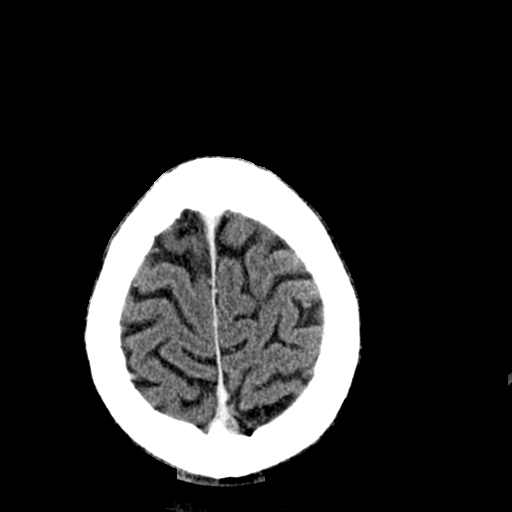
[im 31/36  brain]
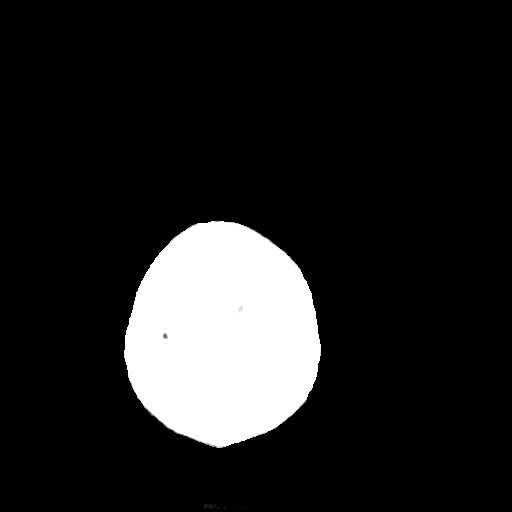
[im 33/36  brain]
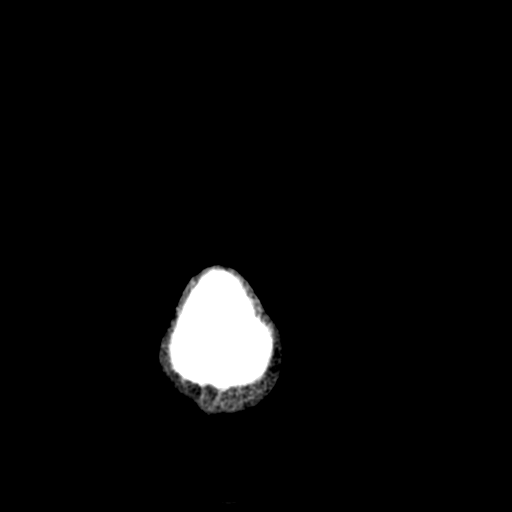
[im 33/36  bone]
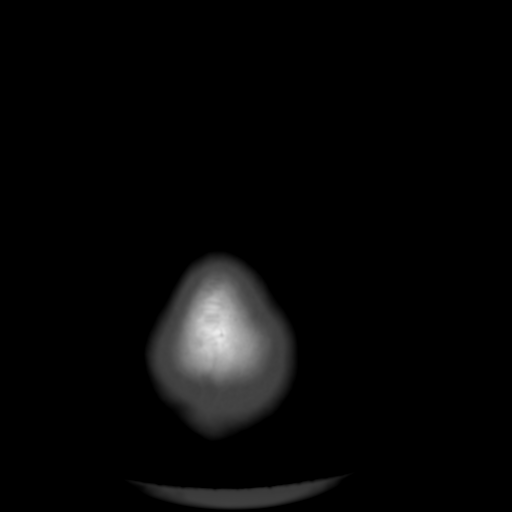

[13 of 30 positions shown; findings below may reference images not displayed]

FINDINGS: Visualized orbits and scalp soft tissues are within
normal limits.  Visualized paranasal sinuses and mastoids are
clear.  No acute osseous abnormality identified.

Cerebral volume is within normal limits for age.  No midline shift,
ventriculomegaly, mass effect, evidence of mass lesion,
intracranial hemorrhage or evidence of cortically based acute
infarction.  Gray-white matter differentiation is within normal
limits throughout the brain.  No abnormal enhancement identified.
Major intracranial vascular structures are enhancing.  Tortuous
basilar artery.
IMPRESSION: No acute or metastatic intracranial abnormality. Normal CT
appearance of the brain.

## 2011-11-11 IMAGING — PT NM PET TUM IMG INITIAL (PI) SKULL BASE T - THIGH
6 series · 25 of 25 positions shown · non-contrast
Comparison: Diagnostic CTs of 09/03/2010

CLINICAL DATA: Initial treatment strategy for esophageal cancer

NUCLEAR MEDICINE PET CT SKULL BASE TO THIGH
TECHNIQUE: 19.2 mCi F-18 FDG was injected intravenously via the
right AC.  Full-ring PET imaging was performed from the skull base
through the mid-thighs 66  minutes after injection.  CT data was
obtained and used for attenuation correction and anatomic
localization only.  (This was not acquired as a diagnostic CT
examination.)
Fasting Blood Glucose:  107

[Series 1: pet ac · axial · 3.3mm · 4.69mm/px · z∈[-880,-10]mm · 5 of 267 slices shown]
[im 1/267]
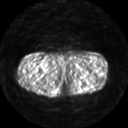
[im 67/267]
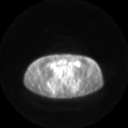
[im 134/267]
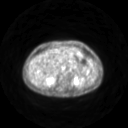
[im 200/267]
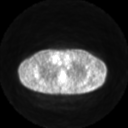
[im 267/267]
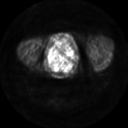

[Series 2: ct images · axial · 3.8mm · 0.98mm/px · z∈[-880,-10]mm · 6 of 267 slices shown]
[im 1/267]
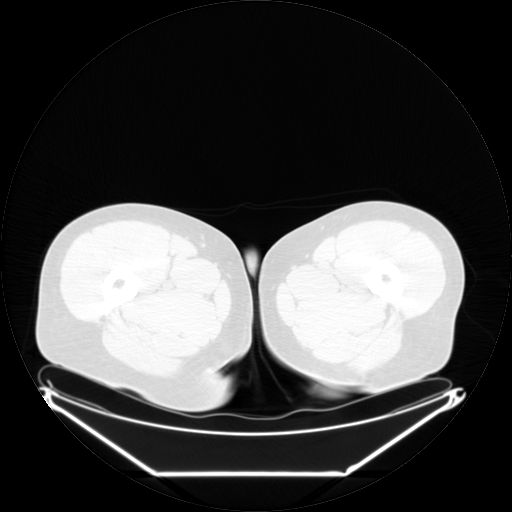
[im 54/267]
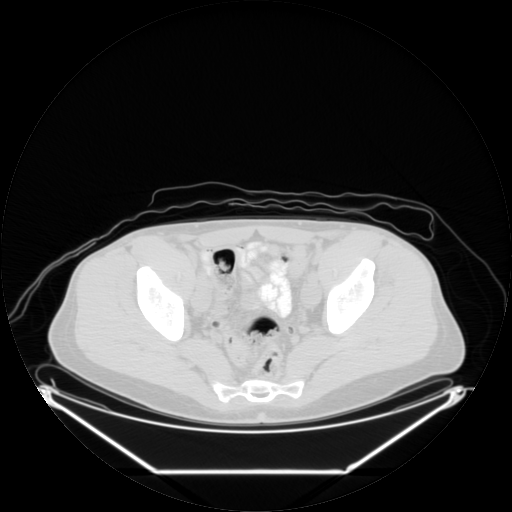
[im 107/267]
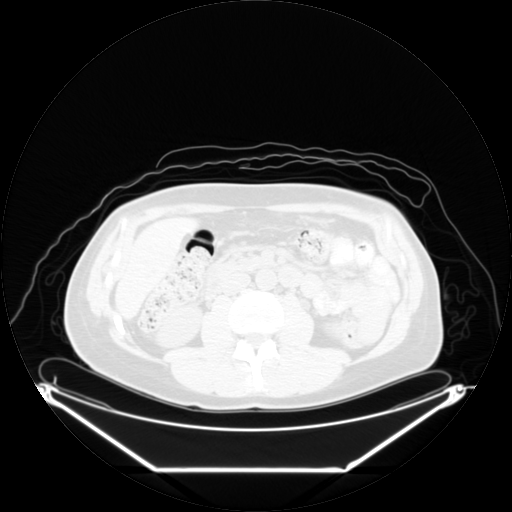
[im 160/267]
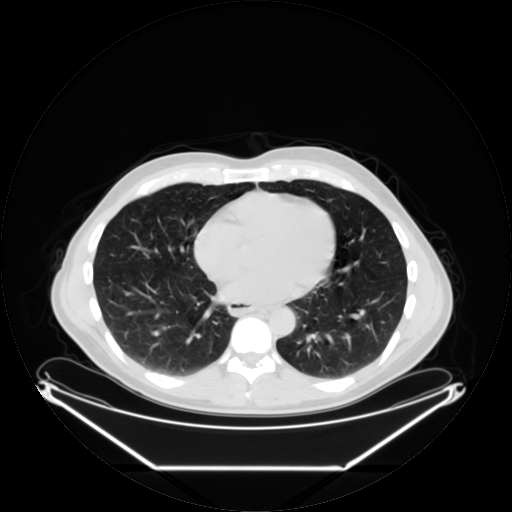
[im 213/267]
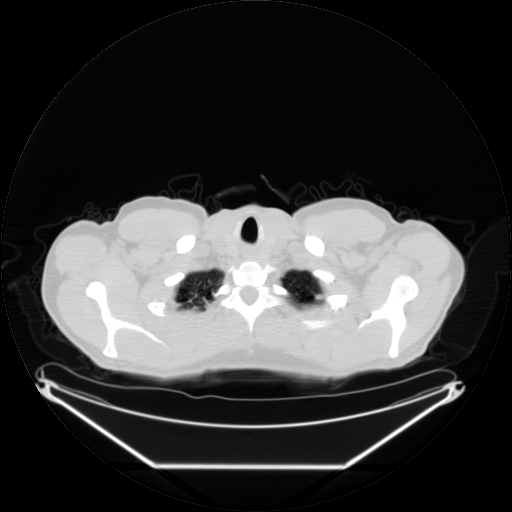
[im 267/267  brain]
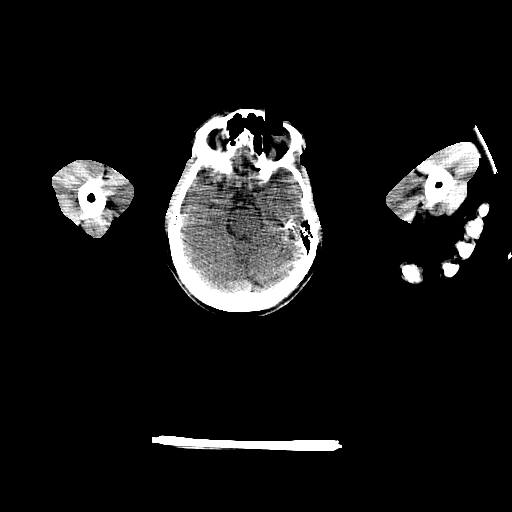

[Series 2: pet nac · axial · 3.3mm · 4.69mm/px · z∈[-880,-10]mm · 6 of 267 slices shown]
[im 1/267]
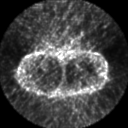
[im 54/267]
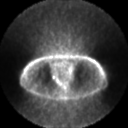
[im 107/267]
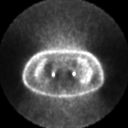
[im 160/267]
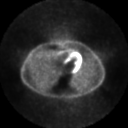
[im 213/267]
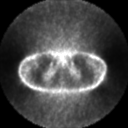
[im 267/267]
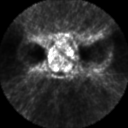

[Series 123: mip · coronal · 3.3mm · 4.69mm/px · 1 of 30 slices shown]
[im 1/30]
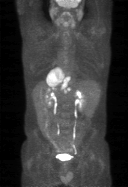

[Series 151: reformatted · axial · 3.3mm · 3.91mm/px · z∈[-880,-10]mm · 6 of 265 slices shown (1 of 2)]
[im 1/265]
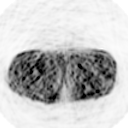
[im 53/265]
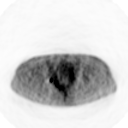
[im 106/265]
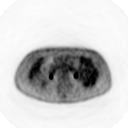
[im 159/265]
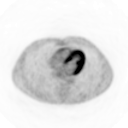
[im 212/265]
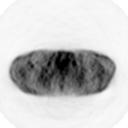
[im 265/265]
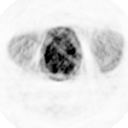

[Series 153: reformatted · coronal · 4.7mm · 6.98mm/px · 1 of 67 slices shown (2 of 2)]
[im 1/67]
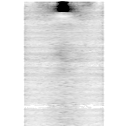

[25 of 25 positions shown; findings below may reference images not displayed]

FINDINGS: PET images demonstrate no abnormal activity within the
neck.  Hypermetabolism corresponding to wall thickening within the
distal esophagus.  This measures a S.U.V. max of 13.8, including on
image 122.

2 hypermetabolic hepatoduodenal ligament lymph nodes.  A more
superior node measures 1.9 x 2.0 cm, a S.U.V. max of 10.7 on image
132.
A more posterior and inferior node measures 2.0 x 2.0 cm and a
S.U.V. max of 7.6 on image 135.

No abnormal activity within the pelvis.

CT images performed for attenuation correction demonstrate mucous
retention cyst or polyp in the left maxillary sinus.  Chest,
abdomen, and pelvic findings deferred to recent diagnostic CTs.
IMPRESSION: Distal esophageal primary with localized nodal metastasis in the
gastrohepatic ligament.

## 2012-01-29 ENCOUNTER — Telehealth: Payer: Self-pay | Admitting: Oncology

## 2012-01-29 NOTE — Telephone Encounter (Signed)
Pt called wans to cancel appty for 7/25 and will call to r/s at a later time.RN notified

## 2012-01-30 ENCOUNTER — Ambulatory Visit: Payer: Managed Care, Other (non HMO) | Admitting: Oncology

## 2012-05-18 ENCOUNTER — Other Ambulatory Visit: Payer: Self-pay | Admitting: Gastroenterology

## 2012-05-25 ENCOUNTER — Telehealth: Payer: Self-pay | Admitting: Oncology

## 2012-05-25 NOTE — Telephone Encounter (Signed)
Patient came by and r/s appt from July 2013, MD only

## 2012-06-02 ENCOUNTER — Telehealth: Payer: Self-pay | Admitting: Oncology

## 2012-06-02 ENCOUNTER — Ambulatory Visit (HOSPITAL_BASED_OUTPATIENT_CLINIC_OR_DEPARTMENT_OTHER): Payer: Managed Care, Other (non HMO) | Admitting: Oncology

## 2012-06-02 VITALS — BP 114/72 | HR 72 | Temp 97.0°F | Resp 20 | Ht 75.0 in | Wt 187.5 lb

## 2012-06-02 DIAGNOSIS — C160 Malignant neoplasm of cardia: Secondary | ICD-10-CM

## 2012-06-02 DIAGNOSIS — C159 Malignant neoplasm of esophagus, unspecified: Secondary | ICD-10-CM

## 2012-06-02 DIAGNOSIS — C155 Malignant neoplasm of lower third of esophagus: Secondary | ICD-10-CM

## 2012-06-02 NOTE — Telephone Encounter (Signed)
appts made and printed for pt aom °

## 2012-06-02 NOTE — Progress Notes (Signed)
[facility]    OFFICE PROGRESS NOTE   INTERVAL HISTORY:   He returns as scheduled. He is eating a normal diet. He continues to work. He had a "cold "last week with a cough, sinus drainage, and nausea. The symptoms have resolved. He noted recent enlargement of the submandibular glands. He notes some wheezing with the cold weather. This is relieved with an inhaler.  Objective:  Vital signs in last 24 hours:  Blood pressure 114/72, pulse 72, temperature 97 F (36.1 C), temperature source Oral, resp. rate 20, height 6\' 3"  (1.905 m), weight 187 lb 8 oz (85.049 kg).    HEENT: Oropharynx without visible mass, neck without mass Lymphatics: No cervical, supraclavicular, axillary, or inguinal nodes. Slight prominence of the right compared to left submandibular gland Resp: Lungs clear bilaterally Cardio: Regular rate and rhythm GI: No hepatomegaly, no mass Vascular: The left lower leg is slightly larger than the right side. No erythema.   Medications: I have reviewed the patient's current medications.  Assessment/Plan: 1. 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 A fine-needle aspiration of a hepatoduodenal lymph node confirmed adenocarcinoma. A staging PET scan 09/10/2010 revealed hypermetabolism corresponding to wall thickening of the distal esophagus with an SUV of 13.8 and 2 hypermetabolic hepatoduodenal ligament lymph nodes.  a. He began concurrent Taxol/carboplatin chemotherapy and radiation 09/24/2010. Radiation was completed 11/01/2010 and the last chemotherapy was given 10/29/2010.  b. Restaging x-ray evaluation at [facility] 11/21/2010 revealed no evidence of distant metastatic disease.  c. Status post an esophagogastrectomy 12/24/2010 with pathology showing a 2.5 cm poorly differentiated adenocarcinoma of the distal esophagus with invasion through the full thickness of the muscularis propria and into the paraesophageal soft tissue with negative surgical  margins. No lymphovascular invasion and no perineural invasion were identified. Metastatic adenocarcinoma was identified in 7/13 regional lymph nodes (yPT3 yPN3).  d. He began adjuvant FOLFOX chemotherapy on 03/14/2011. He has completed 8 cycles. Cycle 8 was given 07/01/2011. e. Restaging chest CT done 06/12/2011 at [facility] showed postsurgical changes and no evidence of local recurrence or metastatic disease to the chest. f. Restaging CT the chest 12/18/2011 at [facility] evidence of metastatic disease 2. Postoperative left lower extremity deep vein thrombosis. He discontinued Lovenox after an office visit with Dr.Tong on 06/12/2011 3. Nutrition, now maintained a regular diet. 4. Left calf pain 09/20/2010. A venous Doppler was negative for deep vein thrombosis or superficial thrombosis. 5. Baseline tinnitus of the left ear. 6. Multiple subcutaneous nodular lesions, likely lipomas. 7. History of diverticulitis. 8. History of colonic polyps. Status post a colonoscopy November 2013-we will followup on the report 9. Family history of esophagus cancer. 10. Superficial phlebitis of the left arm 10/08/2010. 11. Delayed nausea following cycle #1 of FOLFOX, Aloxi was added with cycle #2. 12. Neutropenia secondary to chemotherapy. Cycle #2 of FOLFOX was delayed. Chemotherapy was dose reduced and he received Neulasta beginning with cycle #2. 13. Intermittent tingling in the hands and feet, tingling at the lower legs/feet with neck flexion. Question if related to previous oxaliplatin. The tingling at the lower legs/feet with neck flexion could also be related to previous radiation. No report of neuropathy symptoms today.   Disposition:  He remains in clinical remission from esophagus cancer. He is scheduled to see Dr. Glenna Durand at [facility] with a restaging CT on 06/17/2012. He will return for an office visit here in 4 months. I suspect the slight prominence of the submandibular glands is a benign finding. He will contact us  if these areas change.   Thornton Papas, MD  06/02/2012  10:27 AM

## 2012-07-21 ENCOUNTER — Emergency Department (HOSPITAL_COMMUNITY): Payer: Worker's Compensation

## 2012-07-21 ENCOUNTER — Emergency Department (HOSPITAL_COMMUNITY)
Admission: EM | Admit: 2012-07-21 | Discharge: 2012-07-22 | Disposition: A | Discharge status: Home / Self Care | Payer: Worker's Compensation | Attending: Emergency Medicine | Admitting: Emergency Medicine

## 2012-07-21 DIAGNOSIS — IMO0002 Reserved for concepts with insufficient information to code with codable children: Secondary | ICD-10-CM | POA: Insufficient documentation

## 2012-07-21 DIAGNOSIS — Z860100 Personal history of colon polyps, unspecified: Secondary | ICD-10-CM | POA: Insufficient documentation

## 2012-07-21 DIAGNOSIS — Z79899 Other long term (current) drug therapy: Secondary | ICD-10-CM | POA: Insufficient documentation

## 2012-07-21 DIAGNOSIS — S61419A Laceration without foreign body of unspecified hand, initial encounter: Secondary | ICD-10-CM

## 2012-07-21 DIAGNOSIS — Z86718 Personal history of other venous thrombosis and embolism: Secondary | ICD-10-CM | POA: Insufficient documentation

## 2012-07-21 DIAGNOSIS — Z8501 Personal history of malignant neoplasm of esophagus: Secondary | ICD-10-CM | POA: Insufficient documentation

## 2012-07-21 DIAGNOSIS — Y9289 Other specified places as the place of occurrence of the external cause: Secondary | ICD-10-CM | POA: Insufficient documentation

## 2012-07-21 DIAGNOSIS — S61409A Unspecified open wound of unspecified hand, initial encounter: Secondary | ICD-10-CM | POA: Insufficient documentation

## 2012-07-21 DIAGNOSIS — Z8589 Personal history of malignant neoplasm of other organs and systems: Secondary | ICD-10-CM | POA: Insufficient documentation

## 2012-07-21 DIAGNOSIS — W268XXA Contact with other sharp object(s), not elsewhere classified, initial encounter: Secondary | ICD-10-CM | POA: Insufficient documentation

## 2012-07-21 DIAGNOSIS — Z8719 Personal history of other diseases of the digestive system: Secondary | ICD-10-CM | POA: Insufficient documentation

## 2012-07-21 DIAGNOSIS — J45909 Unspecified asthma, uncomplicated: Secondary | ICD-10-CM | POA: Insufficient documentation

## 2012-07-21 DIAGNOSIS — Y9389 Activity, other specified: Secondary | ICD-10-CM | POA: Insufficient documentation

## 2012-07-21 NOTE — ED Notes (Signed)
Pt ambulatory to exam room with steady gait.  

## 2012-07-21 NOTE — ED Provider Notes (Signed)
History  Scribed for Alfred Gourd, PA-C/ Alfred Mackie, MD, the patient was seen in room WTR7/WTR7. This chart was scribed by Candelaria Stagers. The patient's care started at 11:25 PM   CSN: [id number]  Arrival date & time 07/21/12  2242   First MD Initiated Contact with Patient 07/21/12 2312      Chief Complaint  Patient presents with  . Laceration     The history is provided by the patient. No language interpreter was used.   Alfred Delgado is a 52 y.o. male who presents to the Emergency Department complaining of a laceration to his left palm that occurred while sawing a piece of metal about one hour ago while at work.  He describes the pain as 2/10.  He denies dizziness or light headedness.  He has no other injuries.  He reports his tetanus is up to date.     Past Medical History  Diagnosis Date  . NEOPLASM, MALIGNANT, ESOPHAGUS 08/30/2010  . Asthma   . History of colon polyps   . Multiple lipomas     History of  . DVT of lower extremity (deep venous thrombosis)   . History of colonic diverticulitis     Past Surgical History  Procedure Date  . Portacath placement 03/14/2011  . Tonsillectomy   . Esophagogastrectomy 2012    Dr. Arbie Cookey    No family history on file.  History  Substance Use Topics  . Smoking status: Not on file  . Smokeless tobacco: Not on file  . Alcohol Use: Not on file      Review of Systems  Constitutional: Negative for fever and chills.  Respiratory: Negative for shortness of breath.   Gastrointestinal: Negative for nausea and vomiting.  Skin: Positive for wound (laceration to the palmar aspect of the left hand).  Neurological: Negative for dizziness, weakness and light-headedness.  All other systems reviewed and are negative.    Allergies  Review of patient's allergies indicates no known allergies.  Home Medications   Current Outpatient Rx  Name  Route  Sig  Dispense  Refill  . ALBUTEROL SULFATE HFA 108 (90 BASE) MCG/ACT IN AERS  Inhalation   Inhale 1 puff into the lungs every 4 (four) hours as needed. Wheezing          . CATS CLAW PO   Oral   Take 2 capsules by mouth daily.           [name] VITAMIN D 1000 UNITS PO TABS   Oral   Take 5,000 Units by mouth daily.         [name] DIGESTIVE ENZYMES PO CAPS   Oral   Take 1 capsule by mouth daily.           [name] GENTLE IRON PO   Oral   Take 1 tablet by mouth daily.           [name] PROBIOTIC FORMULA PO   Oral   Take 2 capsules by mouth daily.           [name] SALINE NASAL SPRAY 0.65 % NA SOLN   Nasal   Place 2 sprays into the nose 2 (two) times daily as needed. Allergies           . VITAMIN B-12 1000 MCG PO TABS   Oral   Take 1,000 mcg by mouth daily.             BP 116/70  Pulse 71  Temp 98.1 F (36.7 C) (  Oral)  Resp 16  SpO2 100%  Physical Exam  Nursing note and vitals reviewed. Constitutional: He is oriented to person, place, and time. He appears well-developed and well-nourished. No distress.  HENT:  Head: Normocephalic and atraumatic.  Eyes: EOM are normal.  Neck: Neck supple. No tracheal deviation present.  Cardiovascular: Normal rate and regular rhythm.   Pulmonary/Chest: Effort normal. No respiratory distress. He has no wheezes. He has no rales.  Musculoskeletal: Normal range of motion.       1.5 cm laceration to the palmar aspect of the hypothenar eminence of the left hand.  Normal strength.  No evidence of tendon disruption.  Flexion and extension normal.  Normal capillary refill.   Neurological: He is alert and oriented to person, place, and time.  Skin: Skin is warm and dry.  Psychiatric: He has a normal mood and affect. His behavior is normal.    ED Course  Procedures  LACERATION REPAIR Performed by: Alfred Delgado Authorized by: Alfred Delgado Consent: Verbal consent obtained. Risks and benefits: risks, benefits and alternatives were discussed Consent given by: patient Patient identity confirmed: provided demographic  data Prepped and Draped in normal sterile fashion Wound explored  Laceration Location: left hand  Laceration Length: 1.5 cm  No Foreign Bodies seen or palpated  Anesthesia: local infiltration  Local anesthetic: lidocaine 2% without epinephrine  Anesthetic total: 3 ml  Irrigation method: syringe Amount of cleaning: standard  Skin closure: 4-0 prolene  Number of sutures: 4  Technique: simple interrupted  Patient tolerance: Patient tolerated the procedure well with no immediate complications.  DIAGNOSTIC STUDIES: Oxygen Saturation is 100% on room air, normal by my interpretation.    COORDINATION OF CARE:    Labs Reviewed - No data to display Dg Hand Complete Left  07/21/2012  *RADIOLOGY REPORT*  Clinical Data: Pain, laceration  LEFT HAND - COMPLETE 3+ VIEW  Comparison: None.  Findings: No radiodense foreign body or subcutaneous gas. Negative for fracture, dislocation, or other acute abnormality.  Normal alignment and mineralization. No significant degenerative change. Regional soft tissues unremarkable.  IMPRESSION:  Negative   Original Report Authenticated By: D. Andria Rhein, MD      1. Hand laceration       MDM  52 y/o male with laceration to left hand. No evidence of tendinous disruption. Neurovascularly intact. No FB on xray. Tdap UTD within the past 5 years. 4 sutures placed. Wound care given. Return precautions discussed. Patient states understanding of plan and is agreeable.  I personally performed the services described in this documentation, which was scribed in my presence. The recorded information has been reviewed and is accurate.         Alfred Mace, PA-C 07/22/12 [phone]

## 2012-07-21 NOTE — ED Notes (Signed)
Pt c/o laceration to L palm on ulnar side of hand. Bleeding controlled. Pt states he cut his hand on a piece of metal. Pt states his tetanus is up to date.

## 2012-07-22 NOTE — ED Provider Notes (Signed)
Medical screening examination/treatment/procedure(s) were performed by non-physician practitioner and as supervising physician I was immediately available for consultation/collaboration.  Olivia Mackie, MD 07/22/12 [phone]

## 2012-10-01 ENCOUNTER — Ambulatory Visit (HOSPITAL_BASED_OUTPATIENT_CLINIC_OR_DEPARTMENT_OTHER): Payer: Managed Care, Other (non HMO) | Admitting: Oncology

## 2012-10-01 ENCOUNTER — Telehealth: Payer: Self-pay | Admitting: Oncology

## 2012-10-01 VITALS — BP 106/66 | HR 67 | Temp 96.7°F | Resp 18 | Ht 75.0 in | Wt 185.5 lb

## 2012-10-01 DIAGNOSIS — L539 Erythematous condition, unspecified: Secondary | ICD-10-CM

## 2012-10-01 DIAGNOSIS — C159 Malignant neoplasm of esophagus, unspecified: Secondary | ICD-10-CM

## 2012-10-01 DIAGNOSIS — Z8501 Personal history of malignant neoplasm of esophagus: Secondary | ICD-10-CM

## 2012-10-01 DIAGNOSIS — R05 Cough: Secondary | ICD-10-CM

## 2012-10-01 NOTE — Progress Notes (Signed)
[facility]    OFFICE PROGRESS NOTE   INTERVAL HISTORY:   He returns as scheduled. He underwent a restaging CT of the chest at [facility] on 06/17/2012. Stable postoperative changes were noted. No acute abnormality or significant change from the prior study.  He reports developing a fever, rhinorrhea, and cough last weekend. No dyspnea. The fever has resolved. He continues to have a cough. He has been using his inhaler. Prior to last weekend he felt well. Good appetite and energy level. No difficulty with eating.  Objective:  Vital signs in last 24 hours:  Blood pressure 106/66, pulse 67, temperature 96.7 F (35.9 C), temperature source Oral, resp. rate 18, height 6\' 3"  (1.905 m), weight 185 lb 8 oz (84.142 kg).    HEENT: Neck without mass Lymphatics: No cervical or supraclavicular nodes. "Shotty "axillary nodes. Resp: Bilateral expiratory rhonchi at the lower posterior chest. Good air movement bilaterally. No respiratory distress Cardio: Regular rate and rhythm GI: No hepatosplenomegaly, no mass, nontender Vascular: No leg edema, the left lower leg is slightly larger than the right side  Skin: Circular plaque of erythema at the left lower leg    Medications: I have reviewed the patient's current medications.  Assessment/Plan: 1. 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 A fine-needle aspiration of a hepatoduodenal lymph node confirmed adenocarcinoma. A staging PET scan 09/10/2010 revealed hypermetabolism corresponding to wall thickening of the distal esophagus with an SUV of 13.8 and 2 hypermetabolic hepatoduodenal ligament lymph nodes.  a. He began concurrent Taxol/carboplatin chemotherapy and radiation 09/24/2010. Radiation was completed 11/01/2010 and the last chemotherapy was given 10/29/2010.  b. Restaging x-ray evaluation at [facility] 11/21/2010 revealed no evidence of distant metastatic disease.  c. Status post an esophagogastrectomy 12/24/2010 with  pathology showing a 2.5 cm poorly differentiated adenocarcinoma of the distal esophagus with invasion through the full thickness of the muscularis propria and into the paraesophageal soft tissue with negative surgical margins. No lymphovascular invasion and no perineural invasion were identified. Metastatic adenocarcinoma was identified in 7/13 regional lymph nodes (yPT3 yPN3).  d. He began adjuvant FOLFOX chemotherapy on 03/14/2011. He has completed 8 cycles. Cycle 8 was given 07/01/2011. e. Restaging chest CT done 06/12/2011 at [facility] showed postsurgical changes and no evidence of local recurrence or metastatic disease to the chest. f. Restaging CT the chest 12/18/2011 at [facility] evidence of metastatic disease g. Restaging CT the chest 06/17/2012 at [facility] evidence of recurrent disease 2. Postoperative left lower extremity deep vein thrombosis. He discontinued Lovenox after an office visit with Dr.Tong on 06/12/2011 3. Nutrition, now maintained a regular diet. 4. Left calf pain 09/20/2010. A venous Doppler was negative for deep vein thrombosis or superficial thrombosis. 5. Baseline tinnitus of the left ear. 6. Multiple subcutaneous nodular lesions, likely lipomas. 7. History of diverticulitis. 8. History of colonic polyps. Status post a colonoscopy November 2013-we will followup on the report 9. Family history of esophagus cancer. 10. Superficial phlebitis of the left arm 10/08/2010. 11. Delayed nausea following cycle #1 of FOLFOX, Aloxi was added with cycle #2. 12. Neutropenia secondary to chemotherapy. Cycle #2 of FOLFOX was delayed. Chemotherapy was dose reduced and he received Neulasta beginning with cycle #2. 13. Intermittent tingling in the hands and feet, tingling at the lower legs/feet with neck flexion. Question if related to previous oxaliplatin. The tingling at the lower legs/feet with neck flexion could also be related to previous radiation. No report of neuropathy symptoms today.   14. Fever, cough, and wheezing-likely  a viral upper respiratory infection most consistent with bronchitis  Disposition:  He remains in clinical remission from esophagus cancer. He plans to followup with Dr. Glenna Durand in June with a restaging CT scan. He will see a dermatologist if the area of erythema at the left lower leg does not resolve. He will seek medical attention if the cough does not resolve. I have a low clinical suspicion for pneumonia or a pulmonary embolism.   Thornton Papas, MD  10/01/2012  5:21 PM

## 2012-12-31 ENCOUNTER — Telehealth: Payer: Self-pay | Admitting: *Deleted

## 2012-12-31 NOTE — Telephone Encounter (Signed)
Called pt to discuss anemia per [facility] note & infomed that Dr Truett Perna said he could f/u with his PCP or Dr. Truett Perna for this.  He reports that he is trying to eat better/healthier & will just discuss with Dr Truett Perna at next appt.  He said he was just below parameter to give blood & that is how he found out about the anemia.  He does have a PCP & may f/u with them but he was not real committal.  He states he is doing OK.

## 2013-01-01 ENCOUNTER — Encounter: Payer: Self-pay | Admitting: Internal Medicine

## 2013-04-01 ENCOUNTER — Telehealth: Payer: Self-pay | Admitting: Oncology

## 2013-04-01 ENCOUNTER — Ambulatory Visit (HOSPITAL_BASED_OUTPATIENT_CLINIC_OR_DEPARTMENT_OTHER): Payer: Managed Care, Other (non HMO) | Admitting: Lab

## 2013-04-01 ENCOUNTER — Ambulatory Visit (HOSPITAL_BASED_OUTPATIENT_CLINIC_OR_DEPARTMENT_OTHER): Payer: Managed Care, Other (non HMO) | Admitting: Nurse Practitioner

## 2013-04-01 ENCOUNTER — Ambulatory Visit: Payer: Self-pay | Admitting: Oncology

## 2013-04-01 VITALS — BP 108/59 | HR 70 | Temp 97.8°F | Resp 18 | Ht 75.0 in | Wt 193.7 lb

## 2013-04-01 DIAGNOSIS — C159 Malignant neoplasm of esophagus, unspecified: Secondary | ICD-10-CM

## 2013-04-01 DIAGNOSIS — Z8501 Personal history of malignant neoplasm of esophagus: Secondary | ICD-10-CM

## 2013-04-01 LAB — CBC WITH DIFFERENTIAL/PLATELET
Basophils Absolute: 0 10*3/uL (ref 0.0–0.1)
EOS%: 1.4 % (ref 0.0–7.0)
HCT: 33.9 % — ABNORMAL LOW (ref 38.4–49.9)
HGB: 11.6 g/dL — ABNORMAL LOW (ref 13.0–17.1)
MCH: 31.7 pg (ref 27.2–33.4)
MCV: 92.7 fL (ref 79.3–98.0)
MONO%: 10.1 % (ref 0.0–14.0)
NEUT%: 67.8 % (ref 39.0–75.0)
RDW: 13.2 % (ref 11.0–14.6)

## 2013-04-01 NOTE — Telephone Encounter (Signed)
gv adn printed appt sched and avs for pt for march 2015 °

## 2013-04-01 NOTE — Progress Notes (Signed)
OFFICE PROGRESS NOTE  Interval history:  Alfred Delgado returns as scheduled. Restaging CT of the chest at [facility] on 12/16/2012 showed stable postoperative changes with no evidence of disease recurrence or metastasis.  He feels well. He has a good appetite. He denies weight loss. He denies any fevers. No pain. He is swallowing without difficulty. No shortness of breath except related to "asthma". No cough. Bowels moving regularly. No hematuria or dysuria. He denies numbness or tingling in his hands or feet. He notes dry, itchy skin at the left lower leg. He denies any bleeding.  He reports attempting to donate blood recently and being told he had "anemia".   Objective: Blood pressure 108/59, pulse 70, temperature 97.8 F (36.6 C), temperature source Oral, resp. rate 18, height 6\' 3"  (1.905 m), weight 193 lb 11.2 oz (87.862 kg).  Oropharynx is without thrush or ulceration. No palpable cervical, supraclavicular, axillary or inguinal lymph nodes. Lungs are clear. Regular cardiac rhythm. Abdomen soft and nontender. No organomegaly. Extremities without edema. Left lower leg is slightly larger than the right lower leg. 3 patches of dry erythematous skin at the left lower leg.  Lab Results: Lab Results  Component Value Date   WBC 3.8* 04/01/2013   HGB 11.6* 04/01/2013   HCT 33.9* 04/01/2013   MCV 92.7 04/01/2013   PLT 190 04/01/2013    Chemistry:    Chemistry      Component Value Date/Time   NA 137 07/01/2011 0909   K 4.0 07/01/2011 0909   CL 102 07/01/2011 0909   CO2 28 07/01/2011 0909   BUN 12 07/01/2011 0909   CREATININE 0.84 07/01/2011 0909      Component Value Date/Time   CALCIUM 9.4 07/01/2011 0909   ALKPHOS 157* 07/01/2011 0909   AST 23 07/01/2011 0909   ALT 14 07/01/2011 0909   BILITOT 0.3 07/01/2011 0909       Studies/Results: No results found.  Medications: I have reviewed the patient's current medications.  Assessment/Plan:  1. Adenocarcinoma of the gastroesophageal  junction. (UT3 N2). A fine-needle aspiration of a hepatoduodenal lymph node confirmed adenocarcinoma. A staging PET scan 09/10/2010 revealed hypermetabolism corresponding to wall thickening of the distal esophagus with an SUV of 13.8 and 2 hypermetabolic hepatoduodenal ligament lymph nodes.  a. He began concurrent Taxol/carboplatin chemotherapy and radiation 09/24/2010. Radiation was completed 11/01/2010 and the last chemotherapy was given 10/29/2010.  b. Restaging x-ray evaluation at [facility] 11/21/2010 revealed no evidence of distant metastatic disease.  c. Status post an esophagogastrectomy 12/24/2010 with pathology showing a 2.5 cm poorly differentiated adenocarcinoma of the distal esophagus with invasion through the full thickness of the muscularis propria and into the paraesophageal soft tissue with negative surgical margins. No lymphovascular invasion and no perineural invasion were identified. Metastatic adenocarcinoma was identified in 7/13 regional lymph nodes (yPT3 yPN3).  d. He began adjuvant FOLFOX chemotherapy on 03/14/2011. He has completed 8 cycles. Cycle 8 was given 07/01/2011. e. Restaging chest CT done 06/12/2011 at [facility] showed postsurgical changes and no evidence of local recurrence or metastatic disease to the chest. f. Restaging CT the chest 12/18/2011 at [facility] evidence of metastatic disease g. Restaging CT the chest 06/17/2012 at [facility]-no evidence of recurrent disease. h. Restaging CT of the chest 12/16/2012 with stable postoperative changes and no evidence of disease recurrence or metastasis. 2. Postoperative left lower extremity deep vein thrombosis. He discontinued Lovenox after an office visit with Dr.Tong on 06/12/2011 3. Nutrition, now maintained a regular diet. 4. Left calf pain 09/20/2010.  A venous Doppler was negative for deep vein thrombosis or superficial thrombosis. 5. Baseline tinnitus of the left ear. 6. Multiple subcutaneous nodular lesions, likely lipomas. 7. History  of diverticulitis. 8. History of colonic polyps. Status post a colonoscopy November 2013. 9. Family history of esophagus cancer. 10. Superficial phlebitis of the left arm 10/08/2010. 11. Delayed nausea following cycle #1 of FOLFOX, Aloxi was added with cycle #2. 12. Neutropenia secondary to chemotherapy. Cycle #2 of FOLFOX was delayed. Chemotherapy was dose reduced and he received Neulasta beginning with cycle #2. 13. Intermittent tingling in the hands and feet, tingling at the lower legs/feet with neck flexion. Question if related to previous oxaliplatin. The tingling at the lower legs/feet with neck flexion could also be related to previous radiation. No report of neuropathy symptoms today.  14. Fever, cough, and wheezing when here on 10/01/2012. Likely a viral upper respiratory infection most consistent with bronchitis. 15. Patches of dry skin left lower leg. He will followup with his primary care provider. 16. "Anemia".  Disposition-he remains in clinical remission from the esophagus cancer. He is scheduled to followup at [facility] in December with a restaging CT scan.  We will obtain a CBC today to evaluate for anemia and contact him with the result.  He will return for a followup visit in 6 months. He will contact the office in the interim with any problems.  Plan reviewed with Dr. Truett Perna.  Alfred Delgado ANP/GNP-BC

## 2013-05-13 ENCOUNTER — Other Ambulatory Visit: Payer: Self-pay

## 2013-09-28 ENCOUNTER — Ambulatory Visit (HOSPITAL_BASED_OUTPATIENT_CLINIC_OR_DEPARTMENT_OTHER): Payer: BC Managed Care – PPO | Admitting: Oncology

## 2013-09-28 ENCOUNTER — Telehealth: Payer: Self-pay | Admitting: Oncology

## 2013-09-28 VITALS — BP 106/66 | HR 73 | Temp 97.6°F | Resp 18 | Ht 75.0 in | Wt 201.9 lb

## 2013-09-28 DIAGNOSIS — Z8501 Personal history of malignant neoplasm of esophagus: Secondary | ICD-10-CM

## 2013-09-28 DIAGNOSIS — D649 Anemia, unspecified: Secondary | ICD-10-CM

## 2013-09-28 DIAGNOSIS — C159 Malignant neoplasm of esophagus, unspecified: Secondary | ICD-10-CM

## 2013-09-28 NOTE — Progress Notes (Signed)
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 He feels well. No dysphagia. He recently had a "cold ". He is working. Good appetite.  A restaging CT of the chest at [facility] on 06/16/2013 revealed stable postsurgical changes and no evidence of residual or metastatic disease.  Objective:  Vital signs in last 24 hours:  Blood pressure 106/66, pulse 73, temperature 97.6 F (36.4 C), temperature source Oral, resp. rate 18, height 6\' 3"  (1.905 m), weight 201 lb 14.4 oz (91.581 kg).    HEENT: Neck without mass Lymphatics: No cervical, supraclavicular, or inguinal nodes. "Shotty "bilateral axillary nodes Resp: Lungs clear bilaterally Cardio: Regular rate and rhythm GI: No hepatomegaly, nontender, no mass Vascular: The left lower leg is slightly larger than the right side. Skin: Mobile Cutaneous nodular lesions at the right costal margin and upper left anterior chest   Lab Results:  Lab Results  Component Value Date   WBC 3.8* 04/01/2013   HGB 11.6* 04/01/2013   HCT 33.9* 04/01/2013   MCV 92.7 04/01/2013   PLT 190 04/01/2013   NEUTROABS 2.6 04/01/2013      Medications: I have reviewed the patient's current medications.  Assessment/Plan: 1. 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 A fine-needle aspiration of a hepatoduodenal lymph node confirmed adenocarcinoma. A staging PET scan 09/10/2010 revealed hypermetabolism corresponding to wall thickening of the distal esophagus with an SUV of 13.8 and 2 hypermetabolic hepatoduodenal ligament lymph nodes.  a. He began concurrent Taxol/carboplatin chemotherapy and radiation 09/24/2010. Radiation was completed 11/01/2010 and the last chemotherapy was given 10/29/2010.  b. Restaging x-ray evaluation at [facility] 11/21/2010 revealed no evidence of distant metastatic disease.  c. Status post an esophagogastrectomy 12/24/2010 with pathology showing  a 2.5 cm poorly differentiated adenocarcinoma of the distal esophagus with invasion through the full thickness of the muscularis propria and into the paraesophageal soft tissue with negative surgical margins. No lymphovascular invasion and no perineural invasion were identified. Metastatic adenocarcinoma was identified in 7/13 regional lymph nodes (yPT3 yPN3).  d. He began adjuvant FOLFOX chemotherapy on 03/14/2011. He has completed 8 cycles. Cycle 8 was given 07/01/2011. e. Restaging chest CT done [facility]12/[facility] showed postsurgical changes and no evidence of local recurrence or metastatic disease to the chest. f. Restaging CT the chest 12/18/2011 at [facility] evidence of metastatic disease g. Restaging CT the chest 06/17/2012 at [facility]-no evidence of recurrent disease. h. Restaging CT of the chest 12/16/2012 with stable postoperative changes and no evidence of disease recurrence or metastasis. i. Restaging CT of the chest 06/16/2013 at [facility] with no evidence of recurrent disease 2. Postoperative left lower extremity deep vein thrombosis. He discontinued Lovenox after an office visit with Dr.Tong on 06/12/2011 3. Nutrition, now maintained a regular diet. 4. Left calf pain 09/20/2010. A venous Doppler was negative for deep vein thrombosis or superficial thrombosis. 5. Baseline tinnitus of the left ear. 6. Multiple subcutaneous nodular lesions, likely lipomas. 7. History of diverticulitis. 8. History of colonic polyps. Status post a colonoscopy November 2013. 9. Family history of esophagus cancer. 10. Superficial phlebitis of the left arm 10/08/2010. 11. Delayed nausea following cycle #1 of FOLFOX, Aloxi was added with cycle #2. 12. Neutropenia secondary to chemotherapy. Cycle #2 of FOLFOX was delayed. Chemotherapy was dose reduced and he received Neulasta beginning with cycle #2. 13. Intermittent tingling in the hands and feet, tingling at the lower legs/feet with neck flexion.  Question if related to  previous oxaliplatin. The tingling at the lower legs/feet with neck flexion could also be related to previous radiation. No neuropathy symptoms remain.  14. Fever, cough, and wheezing when here on 10/01/2012. Likely a viral upper respiratory infection most consistent with bronchitis. 15. Mild anemia on a CBC 04/01/2013  Disposition:  He remains in clinical remission from gastroesophageal carcinoma. He will followup at [facility] for a restaging CT evaluation in June. Alfred Delgado will return for an office visit here in 6 months.   Betsy Coder, MD  09/28/2013  4:11 PM

## 2013-09-28 NOTE — Telephone Encounter (Signed)
gv pt appt schedule for sept °

## 2014-03-29 ENCOUNTER — Ambulatory Visit: Payer: Self-pay | Admitting: Nurse Practitioner

## 2014-06-25 ENCOUNTER — Other Ambulatory Visit: Payer: Self-pay | Admitting: Nurse Practitioner

## 2014-07-19 ENCOUNTER — Telehealth: Payer: Self-pay

## 2014-07-19 ENCOUNTER — Other Ambulatory Visit: Payer: Self-pay

## 2014-07-19 NOTE — Telephone Encounter (Signed)
Rise Paganini, nurse with Dr. Williemae Natter at [facility] called, Dr. Williemae Natter has ordered a CT scan and pt would like it closer, pt requested we order it through [facility]. Informed Rise Paganini pt would have to contact a physician at [facility] for the imaging to be done there. Rise Paganini states she will speak with patient.

## 2015-02-16 ENCOUNTER — Ambulatory Visit (INDEPENDENT_AMBULATORY_CARE_PROVIDER_SITE_OTHER): Payer: BLUE CROSS/BLUE SHIELD | Admitting: Family

## 2015-02-16 ENCOUNTER — Other Ambulatory Visit (INDEPENDENT_AMBULATORY_CARE_PROVIDER_SITE_OTHER): Payer: BLUE CROSS/BLUE SHIELD

## 2015-02-16 ENCOUNTER — Encounter: Payer: Self-pay | Admitting: Family

## 2015-02-16 VITALS — BP 100/80 | HR 67 | Temp 98.1°F | Resp 18 | Ht 75.0 in | Wt 196.0 lb

## 2015-02-16 DIAGNOSIS — Z0000 Encounter for general adult medical examination without abnormal findings: Secondary | ICD-10-CM | POA: Insufficient documentation

## 2015-02-16 DIAGNOSIS — D649 Anemia, unspecified: Secondary | ICD-10-CM

## 2015-02-16 DIAGNOSIS — R710 Precipitous drop in hematocrit: Secondary | ICD-10-CM

## 2015-02-16 DIAGNOSIS — Z13228 Encounter for screening for other metabolic disorders: Secondary | ICD-10-CM | POA: Insufficient documentation

## 2015-02-16 DIAGNOSIS — Z130 Encounter for screening for diseases of the blood and blood-forming organs and certain disorders involving the immune mechanism: Secondary | ICD-10-CM | POA: Insufficient documentation

## 2015-02-16 LAB — LIPID PANEL
CHOL/HDL RATIO: 2
Cholesterol: 147 mg/dL (ref 0–200)
HDL: 67.8 mg/dL (ref >39.00)
LDL Cholesterol: 72 mg/dL (ref 0–99)
NONHDL: 79.26
Triglycerides: 36 mg/dL (ref 0.0–149.0)
VLDL: 7.2 mg/dL (ref 0.0–40.0)

## 2015-02-16 LAB — CBC
HEMATOCRIT: 36.6 % — AB (ref 39.0–52.0)
HEMOGLOBIN: 12.3 g/dL — AB (ref 13.0–17.0)
MCHC: 33.6 g/dL (ref 30.0–36.0)
MCV: 88.8 fl (ref 78.0–100.0)
Platelets: 208 10*3/uL (ref 150.0–400.0)
RBC: 4.12 Mil/uL — ABNORMAL LOW (ref 4.22–5.81)
RDW: 15 % (ref 11.5–15.5)
WBC: 4 10*3/uL (ref 4.0–10.5)

## 2015-02-16 LAB — BASIC METABOLIC PANEL
BUN: 9 mg/dL (ref 6–23)
CALCIUM: 9.2 mg/dL (ref 8.4–10.5)
CHLORIDE: 97 meq/L (ref 96–112)
CO2: 27 mEq/L (ref 19–32)
CREATININE: 0.91 mg/dL (ref 0.40–1.50)
GFR: 92.71 mL/min (ref >60.00)
Glucose, Bld: 101 mg/dL — ABNORMAL HIGH (ref 70–99)
Potassium: 4.5 mEq/L (ref 3.5–5.1)
Sodium: 131 mEq/L — ABNORMAL LOW (ref 135–145)

## 2015-02-16 LAB — PSA: PSA: 0.62 ng/mL (ref 0.10–4.00)

## 2015-02-16 LAB — TSH: TSH: 0.61 u[IU]/mL (ref 0.35–4.50)

## 2015-02-16 MED ORDER — ALBUTEROL SULFATE HFA 108 (90 BASE) MCG/ACT IN AERS: 1.0000 | INHALATION_SPRAY | Freq: Four times a day (QID) | RESPIRATORY_TRACT | Status: DC | PRN

## 2015-02-16 NOTE — Assessment & Plan Note (Signed)
1) Anticipatory Guidance: Discussed importance of wearing a seatbelt while driving and not texting while driving; changing batteries in smoke detector at least once annually; wearing suntan lotion when outside; eating a balanced and moderate diet; getting physical activity at least 30 minutes per day.  2) Immunizations / Screenings / Labs:  All immunizations are up to date per recommendations. All screenings are up to date per recommendations. Obtain CBC, BMET, Lipid profile PSA and TSH.   Overall well exam. Patient has minimal risk factors for cardiovascular disease presently. Continue current healthy lifestyle behaviors and choices. Follow-up prevention exam in one year.  Follow-up office visit pending blood work.

## 2015-02-16 NOTE — Patient Instructions (Addendum)
Thank you for choosing [facility].  Summary/Instructions:  Your prescription(s) have been submitted to your pharmacy or been printed and provided for you. Please take as directed and contact our office if you believe you are having problem(s) with the medication(s) or have any questions.  Please stop by the lab on the basement level of the building for your blood work. Your results will be released to [facility] (or called to you) after review, usually within 72 hours after test completion. If any changes need to be made, you will be notified at that same time.  If your symptoms worsen or fail to improve, please contact our office for further instruction, or in case of emergency go directly to the emergency room at the closest medical facility.   Immunization History  Administered Date(s) Administered  . Td 05/18/2010   Health Maintenance A healthy lifestyle and preventative care can promote health and wellness.  Maintain regular health, dental, and eye exams.  Eat a healthy diet. Foods like vegetables, fruits, whole grains, low-fat dairy products, and lean protein foods contain the nutrients you need and are low in calories. Decrease your intake of foods high in solid fats, added sugars, and salt. Get information about a proper diet from your health care provider, if necessary.  Regular physical exercise is one of the most important things you can do for your health. Most adults should get at least 150 minutes of moderate-intensity exercise (any activity that increases your heart rate and causes you to sweat) each week. In addition, most adults need muscle-strengthening exercises on 2 or more days a week.   Maintain a healthy weight. The body mass index (BMI) is a screening tool to identify possible weight problems. It provides an estimate of body fat based on height and weight. Your health care provider can find your BMI and can help you achieve or maintain a healthy weight. For males  20 years and older:  A BMI below 18.5 is considered underweight.  A BMI of 18.5 to 24.9 is normal.  A BMI of 25 to 29.9 is considered overweight.  A BMI of 30 and above is considered obese.  Maintain normal blood lipids and cholesterol by exercising and minimizing your intake of saturated fat. Eat a balanced diet with plenty of fruits and vegetables. Blood tests for lipids and cholesterol should begin at age 56 and be repeated every 5 years. If your lipid or cholesterol levels are high, you are over age 65, or you are at high risk for heart disease, you may need your cholesterol levels checked more frequently.Ongoing high lipid and cholesterol levels should be treated with medicines if diet and exercise are not working.  If you smoke, find out from your health care provider how to quit. If you do not use tobacco, do not start.  Lung cancer screening is recommended for adults aged 78-80 years who are at high risk for developing lung cancer because of a history of smoking. A yearly low-dose CT scan of the lungs is recommended for people who have at least a 30-pack-year history of smoking and are current smokers or have quit within the past 15 years. A pack year of smoking is smoking an average of 1 pack of cigarettes a day for 1 year (for example, a 30-pack-year history of smoking could mean smoking 1 pack a day for 30 years or 2 packs a day for 15 years). Yearly screening should continue until the smoker has stopped smoking for at least 15  years. Yearly screening should be stopped for people who develop a health problem that would prevent them from having lung cancer treatment.  If you choose to drink alcohol, do not have more than 2 drinks per day. One drink is considered to be 12 oz (360 mL) of beer, 5 oz (150 mL) of wine, or 1.5 oz (45 mL) of liquor.  Avoid the use of street drugs. Do not share needles with anyone. Ask for help if you need support or instructions about stopping the use of  drugs.  High blood pressure causes heart disease and increases the risk of stroke. Blood pressure should be checked at least every 1-2 years. Ongoing high blood pressure should be treated with medicines if weight loss and exercise are not effective.  If you are 29-23 years old, ask your health care provider if you should take aspirin to prevent heart disease.  Diabetes screening involves taking a blood sample to check your fasting blood sugar level. This should be done once every 3 years after age 59 if you are at a normal weight and without risk factors for diabetes. Testing should be considered at a younger age or be carried out more frequently if you are overweight and have at least 1 risk factor for diabetes.  Colorectal cancer can be detected and often prevented. Most routine colorectal cancer screening begins at the age of 24 and continues through age 67. However, your health care provider may recommend screening at an earlier age if you have risk factors for colon cancer. On a yearly basis, your health care provider may provide home test kits to check for hidden blood in the stool. A small camera at the end of a tube may be used to directly examine the colon (sigmoidoscopy or colonoscopy) to detect the earliest forms of colorectal cancer. Talk to your health care provider about this at age 85 when routine screening begins. A direct exam of the colon should be repeated every 5-10 years through age 77, unless early forms of precancerous polyps or small growths are found.  People who are at an increased risk for hepatitis B should be screened for this virus. You are considered at high risk for hepatitis B if:  You were born in a country where hepatitis B occurs often. Talk with your health care provider about which countries are considered high risk.  Your parents were born in a high-risk country and you have not received a shot to protect against hepatitis B (hepatitis B vaccine).  You have HIV  or AIDS.  You use needles to inject street drugs.  You live with, or have sex with, someone who has hepatitis B.  You are a man who has sex with other men (MSM).  You get hemodialysis treatment.  You take certain medicines for conditions like cancer, organ transplantation, and autoimmune conditions.  Hepatitis C blood testing is recommended for all people born from [birth year] through 1964 and any individual with known risk factors for hepatitis C.  Healthy men should no longer receive prostate-specific antigen (PSA) blood tests as part of routine cancer screening. Talk to your health care provider about prostate cancer screening.  Testicular cancer screening is not recommended for adolescents or adult males who have no symptoms. Screening includes self-exam, a health care provider exam, and other screening tests. Consult with your health care provider about any symptoms you have or any concerns you have about testicular cancer.  Practice safe sex. Use condoms and avoid high-risk sexual practices  to reduce the spread of sexually transmitted infections (STIs).  You should be screened for STIs, including gonorrhea and chlamydia if:  You are sexually active and are younger than 24 years.  You are older than 24 years, and your health care provider tells you that you are at risk for this type of infection.  Your sexual activity has changed since you were last screened, and you are at an increased risk for chlamydia or gonorrhea. Ask your health care provider if you are at risk.  If you are at risk of being infected with HIV, it is recommended that you take a prescription medicine daily to prevent HIV infection. This is called pre-exposure prophylaxis (PrEP). You are considered at risk if:  You are a man who has sex with other men (MSM).  You are a heterosexual man who is sexually active with multiple partners.  You take drugs by injection.  You are sexually active with a partner who has  HIV.  Talk with your health care provider about whether you are at high risk of being infected with HIV. If you choose to begin PrEP, you should first be tested for HIV. You should then be tested every 3 months for as long as you are taking PrEP.  Use sunscreen. Apply sunscreen liberally and repeatedly throughout the day. You should seek shade when your shadow is shorter than you. Protect yourself by wearing long sleeves, pants, a wide-brimmed hat, and sunglasses year round whenever you are outdoors.  Tell your health care provider of new moles or changes in moles, especially if there is a change in shape or color. Also, tell your health care provider if a mole is larger than the size of a pencil eraser.  A one-time screening for abdominal aortic aneurysm (AAA) and surgical repair of large AAAs by ultrasound is recommended for men aged 28-75 years who are current or former smokers.  Stay current with your vaccines (immunizations). Document Released: 12/21/2007 Document Revised: 06/29/2013 Document Reviewed: 11/19/2010 [facility] Patient Information 2015 [place], [place]. This information is not intended to replace advice given to you by your health care provider. Make sure you discuss any questions you have with your health care provider.

## 2015-02-16 NOTE — Progress Notes (Signed)
Subjective:    Patient ID: Alfred Delgado, male    DOB: February 26, 1961, 54 y.o.   MRN: [id number]  Chief Complaint  Patient presents with  . Establish Care    HPI:  Alfred Delgado is a 54 y.o. male who presents today for an annual wellness visit.   1) Health Maintenance -   Diet - Averages about 3 meals per day, oatmeal, eggs, legumes, vegetables, fruits, beef and pork; Caffeine intake about 1/2 pot of coffee  Exercise - 45-60 min of walking 3-4 times per week.    2) Preventative Exams / Immunizations:  Dental -- Up to date  Vision -- Up to date   Health Maintenance  Topic Date Due  . Hepatitis C Screening  1960/10/14  . HIV Screening  05/09/1977  . INFLUENZA VACCINE  02/06/2015  . TETANUS/TDAP  05/18/2020  . COLONOSCOPY  05/18/2022    Immunization History  Administered Date(s) Administered  . Td 05/18/2010   No Known Allergies   Outpatient Prescriptions Prior to Visit  Medication Sig Dispense Refill  . albuterol (PROVENTIL HFA;VENTOLIN HFA) 108 (90 BASE) MCG/ACT inhaler Inhale 1 puff into the lungs every 4 (four) hours as needed. Wheezing     . Cats Claw, Uncaria tomentosa, (CATS CLAW PO) Take 2 capsules by mouth daily.      . Digestive Enzymes CAPS Take 1 capsule by mouth daily.       No facility-administered medications prior to visit.     Past Medical History  Diagnosis Date  . NEOPLASM, MALIGNANT, ESOPHAGUS 08/30/2010  . Asthma   . History of colon polyps   . Multiple lipomas     History of  . DVT of lower extremity (deep venous thrombosis)   . History of colonic diverticulitis      Past Surgical History  Procedure Laterality Date  . Portacath placement  03/14/2011  . Tonsillectomy    . Esophagogastrectomy  2012    Dr. Carlye Grippe     Family History  Problem Relation Age of Onset  . Breast cancer Mother   . Thyroid disease Mother   . Throat cancer Father   . Thyroid disease Maternal Grandmother   . Cancer Paternal Grandmother   . Heart  disease Paternal Grandfather      Social History   Social History  . Marital Status: Married    Spouse Name: N/A  . Number of Children: 0  . Years of Education: 12   Occupational History  . Airplane Mechanic     Social History Main Topics  . Smoking status: Former Smoker -- 1.50 packs/day for 20 years    Types: Cigarettes  . Smokeless tobacco: Not on file  . Alcohol Use: No  . Drug Use: No  . Sexual Activity: Not on file   Other Topics Concern  . Not on file   Social History Narrative   Fun: Motorcycle, walk, church, pottery, bass guitar   Denies religious beliefs effecting health care.     Review of Systems  Constitutional: Denies fever, chills, fatigue, or significant weight gain/loss. HENT: Head: Denies headache or neck pain Ears: Denies changes in hearing, ringing in ears, earache, drainage Nose: Denies discharge, stuffiness, itching, nosebleed, sinus pain Throat: Denies sore throat, hoarseness, dry mouth, sores, thrush Eyes: Denies loss/changes in vision, pain, redness, blurry/double vision, flashing lights Cardiovascular: Denies chest pain/discomfort, tightness, palpitations, shortness of breath with activity, difficulty lying down, swelling, sudden awakening with shortness of breath Respiratory: Denies shortness of breath, cough, sputum  production, wheezing Gastrointestinal: Denies dysphasia, heartburn, change in appetite, nausea, change in bowel habits, rectal bleeding, constipation, diarrhea, yellow skin or eyes Genitourinary: Denies frequency, urgency, burning/pain, blood in urine, incontinence, change in urinary strength. Musculoskeletal: Denies muscle/joint pain, stiffness, back pain, redness or swelling of joints, trauma Skin: Denies rashes, lumps, itching, dryness, color changes, or hair/nail changes Neurological: Denies dizziness, fainting, seizures, weakness, numbness, tingling, tremor Psychiatric - Denies nervousness, stress, depression or memory  loss Endocrine: Denies heat or cold intolerance, sweating, frequent urination, excessive thirst, changes in appetite Hematologic: Denies ease of bruising or bleeding     Objective:     BP 100/80 mmHg  Pulse 67  Temp(Src) 98.1 F (36.7 C) (Oral)  Resp 18  Ht 6\' 3"  (1.905 m)  Wt 196 lb (88.905 kg)  BMI 24.50 kg/m2  SpO2 97% Nursing note and vital signs reviewed.  Physical Exam  Constitutional: He is oriented to person, place, and time. He appears well-developed and well-nourished.  HENT:  Head: Normocephalic.  Right Ear: Hearing, tympanic membrane, external ear and ear canal normal.  Left Ear: Hearing, tympanic membrane, external ear and ear canal normal.  Nose: Nose normal.  Mouth/Throat: Uvula is midline, oropharynx is clear and moist and mucous membranes are normal.  Eyes: Conjunctivae and EOM are normal. Pupils are equal, round, and reactive to light.  Neck: Neck supple. No JVD present. No tracheal deviation present. No thyromegaly present.  Cardiovascular: Normal rate, regular rhythm, normal heart sounds and intact distal pulses.   Pulmonary/Chest: Effort normal and breath sounds normal.  Abdominal: Soft. Bowel sounds are normal. He exhibits no distension and no mass. There is no tenderness. There is no rebound and no guarding.  Musculoskeletal: Normal range of motion. He exhibits no edema or tenderness.  Lymphadenopathy:    He has no cervical adenopathy.  Neurological: He is alert and oriented to person, place, and time. He has normal reflexes. No cranial nerve deficit. He exhibits normal muscle tone. Coordination normal.  Skin: Skin is warm and dry.  Psychiatric: He has a normal mood and affect. His behavior is normal. Judgment and thought content normal.       Assessment & Plan:   Problem List Items Addressed This Visit      Other   Routine general medical examination at a health care facility - Primary    1) Anticipatory Guidance: Discussed importance of wearing  a seatbelt while driving and not texting while driving; changing batteries in smoke detector at least once annually; wearing suntan lotion when outside; eating a balanced and moderate diet; getting physical activity at least 30 minutes per day.  2) Immunizations / Screenings / Labs:  All immunizations are up to date per recommendations. All screenings are up to date per recommendations. Obtain CBC, BMET, Lipid profile PSA and TSH.   Overall well exam. Patient has minimal risk factors for cardiovascular disease presently. Continue current healthy lifestyle behaviors and choices. Follow-up prevention exam in one year.  Follow-up office visit pending blood work.       Relevant Orders   Basic metabolic panel   CBC   Lipid panel   PSA   TSH

## 2015-02-16 NOTE — Progress Notes (Signed)
Pre visit review using our clinic review tool, if applicable. No additional management support is needed unless otherwise documented below in the visit note. 

## 2015-02-19 ENCOUNTER — Encounter: Payer: Self-pay | Admitting: Family

## 2015-10-18 ENCOUNTER — Ambulatory Visit (INDEPENDENT_AMBULATORY_CARE_PROVIDER_SITE_OTHER): Payer: BLUE CROSS/BLUE SHIELD | Admitting: Internal Medicine

## 2015-10-18 ENCOUNTER — Encounter: Payer: Self-pay | Admitting: Internal Medicine

## 2015-10-18 VITALS — BP 114/86 | HR 72 | Temp 98.1°F | Resp 16 | Wt 198.0 lb

## 2015-10-18 DIAGNOSIS — J209 Acute bronchitis, unspecified: Secondary | ICD-10-CM | POA: Diagnosis not present

## 2015-10-18 DIAGNOSIS — J4531 Mild persistent asthma with (acute) exacerbation: Secondary | ICD-10-CM | POA: Diagnosis not present

## 2015-10-18 MED ORDER — METHYLPREDNISOLONE 4 MG PO TBPK: ORAL_TABLET | ORAL | Status: DC

## 2015-10-18 MED ORDER — ALBUTEROL SULFATE HFA 108 (90 BASE) MCG/ACT IN AERS: 1.0000 | INHALATION_SPRAY | Freq: Four times a day (QID) | RESPIRATORY_TRACT | Status: DC | PRN

## 2015-10-18 MED ORDER — AZITHROMYCIN 250 MG PO TABS: ORAL_TABLET | ORAL | Status: DC

## 2015-10-18 NOTE — Progress Notes (Signed)
Subjective:    Patient ID: Alfred Delgado, male    DOB: [birth year], 55 y.o.   MRN: [id number]  HPI He is here for an acute visit for cold symptoms.  His symptoms started about 10-12 days ago.  He thought it was initially allergies, but his symptoms worsened and have not improved.  He states nasal congestion, postnasal drip and a productive cough. He has had some headaches. He is able to walk comfortably without significant shortness of breath or wheezing, but states he may have some. He does have a history of asthma and has been using his albuterol inhaler, but does not feel that it is helping much. He has been taking Claritin daily. He has also been taking over-the-counter cough suppressants.    Medications and allergies reviewed with patient and updated if appropriate.  Patient Active Problem List   Diagnosis Date Noted  . Routine general medical examination at a health care facility 02/16/2015  . NEOPLASM, MALIGNANT, ESOPHAGUS 08/30/2010  . GERD 07/23/2010  . BELCHING 07/23/2010  . UNSPECIFIED HYPOTENSION 06/25/2010  . ALLERGIC RHINITIS 06/25/2010  . ASTHMA 06/25/2010  . CHEST PAIN 06/25/2010  . LUQ PAIN 06/25/2010  . COLONIC POLYPS, HX OF 06/25/2010  . DIVERTICULITIS, HX OF 06/25/2010    Current Outpatient Prescriptions on File Prior to Visit  Medication Sig Dispense Refill  . albuterol (PROVENTIL HFA;VENTOLIN HFA) 108 (90 BASE) MCG/ACT inhaler Inhale 1-2 puffs into the lungs every 6 (six) hours as needed for wheezing or shortness of breath. 1 Inhaler 1   No current facility-administered medications on file prior to visit.    Past Medical History  Diagnosis Date  . NEOPLASM, MALIGNANT, ESOPHAGUS 08/30/2010  . Asthma   . History of colon polyps   . Multiple lipomas     History of  . DVT of lower extremity (deep venous thrombosis)   . History of colonic diverticulitis     Past Surgical History  Procedure Laterality Date  . Portacath placement  03/14/2011  .  Tonsillectomy    . Esophagogastrectomy  2012    Dr. Carlye Grippe    Social History   Social History  . Marital Status: Married    Spouse Name: N/A  . Number of Children: 0  . Years of Education: 12   Occupational History  . Airplane Mechanic     Social History Main Topics  . Smoking status: Former Smoker -- 1.50 packs/day for 20 years    Types: Cigarettes  . Smokeless tobacco: Not on file  . Alcohol Use: No  . Drug Use: No  . Sexual Activity: Not on file   Other Topics Concern  . Not on file   Social History Narrative   Fun: Motorcycle, walk, church, pottery, bass guitar   Denies religious beliefs effecting health care.     Family History  Problem Relation Age of Onset  . Breast cancer Mother   . Thyroid disease Mother   . Throat cancer Father   . Thyroid disease Maternal Grandmother   . Cancer Paternal Grandmother   . Heart disease Paternal Grandfather     Review of Systems  Constitutional: Negative for fever and chills.  HENT: Positive for congestion and postnasal drip. Negative for ear pain, sinus pressure and sore throat.   Respiratory: Positive for cough (productive - thick phlegm) and wheezing (? mild). Negative for shortness of breath.   Cardiovascular: Negative for chest pain.  Gastrointestinal: Negative for nausea, abdominal pain and diarrhea.  Musculoskeletal: Negative for  myalgias.  Neurological: Positive for headaches (mild). Negative for dizziness and light-headedness.       Objective:   Filed Vitals:   10/18/15 1619  BP: 114/86  Pulse: 72  Temp: 98.1 F (36.7 C)  Resp: 16   Filed Weights   10/18/15 1619  Weight: 198 lb (89.812 kg)   Body mass index is 24.75 kg/(m^2).   Physical Exam GENERAL APPEARANCE: Appears stated age, well appearing, NAD EYES: conjunctiva clear, no icterus HEENT: bilateral tympanic membranes and ear canals normal, oropharynx with Moderate erythema, no thyromegaly, trachea midline, no cervical or supraclavicular  lymphadenopathy LUNGS: Unlabored breathing. Good air entry bilaterally. No crackles. Wheezing/coarse breath sounds right side more than left with expiration HEART: Normal S1,S2 without murmurs EXTREMITIES: Without clubbing, cyanosis, or edema        Assessment & Plan:   Acute bronchitis with asthma exacerbation He does have wheezing on exam and likely has bacterial bronchitis Z-Pak Medrol Dosepak-Has taken and tolerated steroids in the past Continue albuterol inhaler-renewed today Continue over-the-counter cold medications/cough suppressants as needed He will call or return if there is no improvement over the next several days

## 2015-10-18 NOTE — Patient Instructions (Signed)

## 2015-10-18 NOTE — Progress Notes (Signed)
Pre visit review using our clinic review tool, if applicable. No additional management support is needed unless otherwise documented below in the visit note. 

## 2016-03-05 ENCOUNTER — Encounter: Payer: Self-pay | Admitting: Student

## 2018-01-30 ENCOUNTER — Encounter: Payer: Self-pay | Admitting: Gastroenterology

## 2018-01-30 ENCOUNTER — Telehealth: Payer: Self-pay | Admitting: Gastroenterology

## 2018-01-30 NOTE — Telephone Encounter (Signed)
Records reviewed by Dr. Silverio Decamp and okay to schedule.  Records placed in the records reviewed folder

## 2018-02-24 DIAGNOSIS — Z923 Personal history of irradiation: Secondary | ICD-10-CM | POA: Insufficient documentation

## 2018-02-24 DIAGNOSIS — Z9221 Personal history of antineoplastic chemotherapy: Secondary | ICD-10-CM | POA: Insufficient documentation

## 2018-03-12 ENCOUNTER — Encounter: Payer: Self-pay | Admitting: Gastroenterology

## 2018-03-16 ENCOUNTER — Ambulatory Visit (AMBULATORY_SURGERY_CENTER): Payer: Self-pay | Admitting: *Deleted

## 2018-03-16 VITALS — Ht 74.0 in | Wt 204.0 lb

## 2018-03-16 DIAGNOSIS — Z86010 Personal history of colonic polyps: Secondary | ICD-10-CM

## 2018-03-16 MED ORDER — NA SULFATE-K SULFATE-MG SULF 17.5-3.13-1.6 GM/177ML PO SOLN: ORAL | 0 refills | Status: DC

## 2018-03-16 NOTE — Progress Notes (Signed)
Patient denies any allergies to eggs or soy. Patient denies any problems with anesthesia/sedation. Patient denies any oxygen use at home. Patient denies taking any diet/weight loss medications or blood thinners. EMMI education assisgned to patient on colonoscopy, this was explained and instructions given to patient. Suprep $50 coupon given to pt.

## 2018-03-17 ENCOUNTER — Encounter: Payer: Self-pay | Admitting: Gastroenterology

## 2018-03-30 ENCOUNTER — Telehealth: Payer: Self-pay | Admitting: Gastroenterology

## 2018-03-30 NOTE — Telephone Encounter (Signed)
Called pt. No answer, left message. Explained that it is okay that he had tomato's last night, but to drink a lot of clear liquids today and to make sure he takes both doses of Suprep and to drink the water with the preps. Explained to call back if he has further questions/concerns.

## 2018-03-31 ENCOUNTER — Ambulatory Visit (AMBULATORY_SURGERY_CENTER): Payer: BLUE CROSS/BLUE SHIELD | Admitting: Gastroenterology

## 2018-03-31 ENCOUNTER — Encounter: Payer: Self-pay | Admitting: Gastroenterology

## 2018-03-31 VITALS — BP 122/73 | HR 65 | Temp 97.8°F | Resp 15 | Ht 74.0 in | Wt 204.0 lb

## 2018-03-31 DIAGNOSIS — Z86010 Personal history of colonic polyps: Secondary | ICD-10-CM | POA: Diagnosis not present

## 2018-03-31 MED ORDER — SODIUM CHLORIDE 0.9 % IV SOLN: 500.0000 mL | Freq: Once | INTRAVENOUS | Status: DC

## 2018-03-31 NOTE — Op Note (Addendum)
[facility] Patient Name: Alfred Delgado Procedure Date: 03/31/2018 8:02 AM MRN: [id number] Endoscopist: Mauri Pole , MD Age: 57 Referring MD:  Date of Birth: [birth year] Gender: Male Account #: [id number] Procedure:                Colonoscopy Indications:              High risk colon cancer surveillance: Personal                            history of colonic polyps, High risk colon cancer                            surveillance: Personal history of adenoma less than                            10 mm in size Medicines:                Monitored Anesthesia Care Procedure:                Pre-Anesthesia Assessment:                           - Prior to the procedure, a History and Physical                            was performed, and patient medications and                            allergies were reviewed. The patient's tolerance of                            previous anesthesia was also reviewed. The risks                            and benefits of the procedure and the sedation                            options and risks were discussed with the patient.                            All questions were answered, and informed consent                            was obtained. Prior Anticoagulants: The patient has                            taken no previous anticoagulant or antiplatelet                            agents. ASA Grade Assessment: II - A patient with                            mild systemic disease. After reviewing the risks  and benefits, the patient was deemed in                            satisfactory condition to undergo the procedure.                           After obtaining informed consent, the colonoscope                            was passed under direct vision. Throughout the                            procedure, the patient's blood pressure, pulse, and                            oxygen saturations were monitored continuously.  The                            Model PCF-H190DL [id number]) scope was introduced                            through the anus and advanced to the the cecum,                            identified by appendiceal orifice and ileocecal                            valve. The colonoscopy was performed without                            difficulty. The patient tolerated the procedure                            well. The quality of the bowel preparation was                            inadequate. The ileocecal valve, appendiceal                            orifice, and rectum were photographed. Scope In: 8:08:14 AM Scope Out: 8:30:16 AM Scope Withdrawal Time: 0 hours 15 minutes 15 seconds  Total Procedure Duration: 0 hours 22 minutes 2 seconds  Findings:                 The perianal and digital rectal examinations were                            normal.                           Scattered small and large-mouthed diverticula were                            found in the sigmoid colon, descending colon,  transverse colon and ascending colon. There was                            narrowing of the colon in association with the                            diverticular opening. There was no evidence of                            diverticular bleeding.                           Non-bleeding internal hemorrhoids were found during                            retroflexion. Complications:            No immediate complications. Estimated Blood Loss:     Estimated blood loss was minimal. Impression:               - Preparation of the colon was inadequate.                           - Moderate diverticulosis in the sigmoid colon, in                            the descending colon, in the transverse colon and                            in the ascending colon. There was narrowing of the                            colon in association with the diverticular opening.                             There was no evidence of diverticular bleeding.                           - Non-bleeding internal hemorrhoids.                           - No specimens collected. Recommendation:           - Patient has a contact number available for                            emergencies. The signs and symptoms of potential                            delayed complications were discussed with the                            patient. Return to normal activities tomorrow.                            Written discharge instructions were provided to  the                            patient.                           - Resume previous diet.                           - Continue present medications.                           - Await pathology results.                           - Repeat colonoscopy at the next available                            appointment because the bowel preparation was                            suboptimal. Mauri Pole, MD 03/31/2018 8:37:51 AM This report has been signed electronically.

## 2018-03-31 NOTE — Patient Instructions (Signed)
Discharge instructions given. Handouts on diverticulosis and hemorrhoids. Repeat colonoscopy scheduled in recovery with previsit. Resume previous medications. YOU HAD AN ENDOSCOPIC PROCEDURE TODAY AT [facility]:   Refer to the procedure report that was given to you for any specific questions about what was found during the examination.  If the procedure report does not answer your questions, please call your gastroenterologist to clarify.  If you requested that your care partner not be given the details of your procedure findings, then the procedure report has been included in a sealed envelope for you to review at your convenience later.  YOU SHOULD EXPECT: Some feelings of bloating in the abdomen. Passage of more gas than usual.  Walking can help get rid of the air that was put into your GI tract during the procedure and reduce the bloating. If you had a lower endoscopy (such as a colonoscopy or flexible sigmoidoscopy) you may notice spotting of blood in your stool or on the toilet paper. If you underwent a bowel prep for your procedure, you may not have a normal bowel movement for a few days.  Please Note:  You might notice some irritation and congestion in your nose or some drainage.  This is from the oxygen used during your procedure.  There is no need for concern and it should clear up in a day or so.  SYMPTOMS TO REPORT IMMEDIATELY:   Following lower endoscopy (colonoscopy or flexible sigmoidoscopy):  Excessive amounts of blood in the stool  Significant tenderness or worsening of abdominal pains  Swelling of the abdomen that is new, acute  Fever of 100F or higher   For urgent or emergent issues, a gastroenterologist can be reached at any hour by calling [phone].   DIET:  We do recommend a small meal at first, but then you may proceed to your regular diet.  Drink plenty of fluids but you should avoid alcoholic beverages for 24 hours.  ACTIVITY:  You should  plan to take it easy for the rest of today and you should NOT DRIVE or use heavy machinery until tomorrow (because of the sedation medicines used during the test).    FOLLOW UP: Our staff will call the number listed on your records the next business day following your procedure to check on you and address any questions or concerns that you may have regarding the information given to you following your procedure. If we do not reach you, we will leave a message.  However, if you are feeling well and you are not experiencing any problems, there is no need to return our call.  We will assume that you have returned to your regular daily activities without incident.  If any biopsies were taken you will be contacted by phone or by letter within the next 1-3 weeks.  Please call us at [phone] if you have not heard about the biopsies in 3 weeks.    SIGNATURES/CONFIDENTIALITY: You and/or your care partner have signed paperwork which will be entered into your electronic medical record.  These signatures attest to the fact that that the information above on your After Visit Summary has been reviewed and is understood.  Full responsibility of the confidentiality of this discharge information lies with you and/or your care-partner.

## 2018-03-31 NOTE — Progress Notes (Signed)
Pt's states no medical or surgical changes since previsit or office visit. 

## 2018-03-31 NOTE — Progress Notes (Signed)
Alert and oriented x3, pleased with MAC, report to RN  

## 2018-04-01 ENCOUNTER — Telehealth: Payer: Self-pay

## 2018-04-01 NOTE — Telephone Encounter (Signed)
  Follow up Call-  Call back number 03/31/2018  Post procedure Call Back phone  # [phone]  Permission to leave phone message Yes  Some recent data might be hidden     Patient questions:  Do you have a fever, pain , or abdominal swelling? No. Pain Score  0 *  Have you tolerated food without any problems? Yes.    Have you been able to return to your normal activities? Yes.    Do you have any questions about your discharge instructions: Diet   No. Medications  No. Follow up visit  No.  Do you have questions or concerns about your Care? No.  Actions: * If pain score is 4 or above: No action needed, pain <4.

## 2018-04-24 ENCOUNTER — Ambulatory Visit (AMBULATORY_SURGERY_CENTER): Payer: Self-pay

## 2018-04-24 VITALS — Ht 74.0 in | Wt 198.8 lb

## 2018-04-24 DIAGNOSIS — Z86010 Personal history of colonic polyps: Secondary | ICD-10-CM

## 2018-04-24 DIAGNOSIS — Z1211 Encounter for screening for malignant neoplasm of colon: Secondary | ICD-10-CM

## 2018-04-24 NOTE — Progress Notes (Signed)
No egg or soy allergy known to patient  No issues with past sedation with any surgeries  or procedures, no intubation problems  No diet pills per patient No home 02 use per patient  No blood thinners per patient  Pt denies issues with constipation  No A fib or A flutter  EMMI video sent to pt's e mail  Pt. declined 

## 2018-04-27 ENCOUNTER — Encounter: Payer: Self-pay | Admitting: Gastroenterology

## 2018-05-07 ENCOUNTER — Encounter: Payer: Self-pay | Admitting: Gastroenterology

## 2018-05-07 ENCOUNTER — Ambulatory Visit (AMBULATORY_SURGERY_CENTER): Payer: BLUE CROSS/BLUE SHIELD | Admitting: Gastroenterology

## 2018-05-07 VITALS — BP 107/67 | HR 75 | Temp 97.7°F | Resp 10 | Ht 74.0 in | Wt 198.0 lb

## 2018-05-07 DIAGNOSIS — D125 Benign neoplasm of sigmoid colon: Secondary | ICD-10-CM

## 2018-05-07 DIAGNOSIS — Z86010 Personal history of colonic polyps: Secondary | ICD-10-CM | POA: Diagnosis present

## 2018-05-07 DIAGNOSIS — K635 Polyp of colon: Secondary | ICD-10-CM

## 2018-05-07 DIAGNOSIS — D123 Benign neoplasm of transverse colon: Secondary | ICD-10-CM

## 2018-05-07 MED ORDER — SODIUM CHLORIDE 0.9 % IV SOLN: 500.0000 mL | Freq: Once | INTRAVENOUS | Status: DC

## 2018-05-07 NOTE — Progress Notes (Signed)
A and O x3. Report to RN. Tolerated MAC anesthesia well.

## 2018-05-07 NOTE — Progress Notes (Signed)
Pt's states no medical or surgical changes since previsit or office visit. 

## 2018-05-07 NOTE — Op Note (Signed)
[place] [facility] Patient Name: Mikhai Bienvenue Procedure Date: 05/07/2018 11:05 AM MRN: [id number] Endoscopist: Mauri Pole , MD Age: 57 Referring MD:  Date of Birth: [birth year] Gender: Male Account #: [id number] Procedure:                Colonoscopy Indications:              High risk colon cancer surveillance: Personal                            history of colonic polyps Medicines:                Monitored Anesthesia Care Procedure:                Pre-Anesthesia Assessment:                           - Prior to the procedure, a History and Physical                            was performed, and patient medications and                            allergies were reviewed. The patient's tolerance of                            previous anesthesia was also reviewed. The risks                            and benefits of the procedure and the sedation                            options and risks were discussed with the patient.                            All questions were answered, and informed consent                            was obtained. Prior Anticoagulants: The patient has                            taken no previous anticoagulant or antiplatelet                            agents. ASA Grade Assessment: II - A patient with                            mild systemic disease. After reviewing the risks                            and benefits, the patient was deemed in                            satisfactory condition to undergo the procedure.  After obtaining informed consent, the colonoscope                            was passed under direct vision. Throughout the                            procedure, the patient's blood pressure, pulse, and                            oxygen saturations were monitored continuously. The                            Model PCF-H190DL [id number]) scope was introduced                            through the anus and advanced to  the the cecum,                            identified by appendiceal orifice and ileocecal                            valve. The colonoscopy was performed without                            difficulty. The patient tolerated the procedure                            well. Scope In: 11:12:53 AM Scope Out: 11:32:59 AM Scope Withdrawal Time: 0 hours 11 minutes 45 seconds  Total Procedure Duration: 0 hours 20 minutes 6 seconds  Findings:                 The perianal and digital rectal examinations were                            normal.                           Two sessile polyps were found in the sigmoid colon                            and transverse colon. The polyps were 1 to 2 mm in                            size. These polyps were removed with a cold snare.                            Resection and retrieval were complete.                           Scattered small and large-mouthed diverticula were                            found in the sigmoid colon, descending colon,  transverse colon and ascending colon.                           Non-bleeding internal hemorrhoids were found during                            retroflexion. The hemorrhoids were small. Complications:            No immediate complications. Estimated Blood Loss:     Estimated blood loss was minimal. Impression:               - Two 1 to 2 mm polyps in the sigmoid colon and in                            the transverse colon, removed with a cold snare.                            Resected and retrieved.                           - Moderate diverticulosis in the sigmoid colon, in                            the descending colon, in the transverse colon and                            in the ascending colon.                           - Non-bleeding internal hemorrhoids. Recommendation:           - Patient has a contact number available for                            emergencies. The signs and symptoms of  potential                            delayed complications were discussed with the                            patient. Return to normal activities tomorrow.                            Written discharge instructions were provided to the                            patient.                           - Resume previous diet.                           - Continue present medications.                           - Await pathology results.                           -  Repeat colonoscopy in 5-10 years for surveillance                            based on pathology results. Mauri Pole, MD 05/07/2018 11:40:48 AM This report has been signed electronically.

## 2018-05-07 NOTE — Patient Instructions (Signed)
YOU HAD AN ENDOSCOPIC PROCEDURE TODAY AT THE [facility]:   Refer to the procedure report that was given to you for any specific questions about what was found during the examination.  If the procedure report does not answer your questions, please call your gastroenterologist to clarify.  If you requested that your care partner not be given the details of your procedure findings, then the procedure report has been included in a sealed envelope for you to review at your convenience later.  YOU SHOULD EXPECT: Some feelings of bloating in the abdomen. Passage of more gas than usual.  Walking can help get rid of the air that was put into your GI tract during the procedure and reduce the bloating. If you had a lower endoscopy (such as a colonoscopy or flexible sigmoidoscopy) you may notice spotting of blood in your stool or on the toilet paper. If you underwent a bowel prep for your procedure, you may not have a normal bowel movement for a few days.  Please Note:  You might notice some irritation and congestion in your nose or some drainage.  This is from the oxygen used during your procedure.  There is no need for concern and it should clear up in a day or so.  SYMPTOMS TO REPORT IMMEDIATELY:   Following lower endoscopy (colonoscopy or flexible sigmoidoscopy):  Excessive amounts of blood in the stool  Significant tenderness or worsening of abdominal pains  Swelling of the abdomen that is new, acute  Fever of 100F or higher   For urgent or emergent issues, a gastroenterologist can be reached at any hour by calling [phone].   DIET:  We do recommend a small meal at first, but then you may proceed to your regular diet.  Drink plenty of fluids but you should avoid alcoholic beverages for 24 hours. Try to increase the fiber in your diet, and drink plenty of water.  ACTIVITY:  You should plan to take it easy for the rest of today and you should NOT DRIVE or use heavy machinery until  tomorrow (because of the sedation medicines used during the test).    FOLLOW UP: Our staff will call the number listed on your records the next business day following your procedure to check on you and address any questions or concerns that you may have regarding the information given to you following your procedure. If we do not reach you, we will leave a message.  However, if you are feeling well and you are not experiencing any problems, there is no need to return our call.  We will assume that you have returned to your regular daily activities without incident.  If any biopsies were taken you will be contacted by phone or by letter within the next 1-3 weeks.  Please call us at [phone] if you have not heard about the biopsies in 3 weeks.    SIGNATURES/CONFIDENTIALITY: You and/or your care partner have signed paperwork which will be entered into your electronic medical record.  These signatures attest to the fact that that the information above on your After Visit Summary has been reviewed and is understood.  Full responsibility of the confidentiality of this discharge information lies with you and/or your care-partner.  Read all of the handouts given to you by your recovery room nurse. 

## 2018-05-07 NOTE — Progress Notes (Signed)
Called to room to assist during endoscopic procedure.  Patient ID and intended procedure confirmed with present staff. Received instructions for my participation in the procedure from the performing physician.  

## 2018-05-08 ENCOUNTER — Telehealth: Payer: Self-pay

## 2018-05-08 NOTE — Telephone Encounter (Signed)
  Follow up Call-  Call back number 05/07/2018 03/31/2018  Post procedure Call Back phone  # [phone]  Permission to leave phone message Yes Yes  Some recent data might be hidden     Left message

## 2018-05-08 NOTE — Telephone Encounter (Signed)
Attempted to reach patient for post-procedure f/u call. No answer. This is the 2nd attempt. Left message for him to please not hesitate to call us if he has any questions/concerns regarding his care.

## 2018-05-15 ENCOUNTER — Encounter: Payer: Self-pay | Admitting: Gastroenterology

## 2019-08-23 DIAGNOSIS — Z20828 Contact with and (suspected) exposure to other viral communicable diseases: Secondary | ICD-10-CM | POA: Diagnosis not present

## 2019-09-02 DIAGNOSIS — Z03818 Encounter for observation for suspected exposure to other biological agents ruled out: Secondary | ICD-10-CM | POA: Diagnosis not present

## 2019-09-02 DIAGNOSIS — Z20828 Contact with and (suspected) exposure to other viral communicable diseases: Secondary | ICD-10-CM | POA: Diagnosis not present

## 2020-04-10 ENCOUNTER — Telehealth: Payer: BLUE CROSS/BLUE SHIELD | Admitting: Physician Assistant

## 2020-04-10 DIAGNOSIS — Z03818 Encounter for observation for suspected exposure to other biological agents ruled out: Secondary | ICD-10-CM | POA: Diagnosis not present

## 2020-04-10 DIAGNOSIS — Z20822 Contact with and (suspected) exposure to covid-19: Secondary | ICD-10-CM | POA: Diagnosis not present

## 2020-04-10 DIAGNOSIS — R062 Wheezing: Secondary | ICD-10-CM

## 2020-04-10 MED ORDER — ALBUTEROL SULFATE HFA 108 (90 BASE) MCG/ACT IN AERS: 2.0000 | INHALATION_SPRAY | Freq: Four times a day (QID) | RESPIRATORY_TRACT | 0 refills | Status: AC | PRN

## 2020-04-10 NOTE — Progress Notes (Signed)
Visit for Asthma  Based on what you have shared with me, it looks like you may have a flare up of your asthma.  Asthma is a chronic (ongoing) lung disease which results in airway obstruction, inflammation and hyper-responsiveness.   Asthma symptoms vary from person to person, with common symptoms including nighttime awakening and decreased ability to participate in normal activities as a result of shortness of breath. It is often triggered by changes in weather, changes in the season, changes in air temperature, or inside (home, school, daycare or work) allergens such as animal dander, mold, mildew, woodstoves or cockroaches.   It can also be triggered by hormonal changes, extreme emotion, physical exertion or an upper respiratory tract illness.     It is important to identify the trigger, and then eliminate or avoid the trigger if possible.   If you have been prescribed medications to be taken on a regular basis, it is important to follow the asthma action plan and to follow guidelines to adjust medication in response to increasing symptoms of decreased peak expiratory flow rate  Treatment: I have prescribed: Albuterol (Proventil HFA; Ventolin HFA) 108 (90 Base) MCG/ACT Inhaler 2 puffs into the lungs every six hours as needed for wheezing or shortness of breath  HOME CARE Only take medications as instructed by your medical team. Consider wearing a mask or scarf to improve breathing air temperature have been shown to decrease irritation and decrease exacerbations Get rest. Taking a steamy shower or using a humidifier may help nasal congestion sand ease sore throat pain. You can place a towel over your head and breathe in the steam from hot water coming from a faucet. Using a saline nasal spray works much the same way.  Cough drops, hare candies and sore throat lozenges may ease your  cough.  Avoid close contacts especially the very you and the elderly Cover your mouth if you cough or sneeze Always remember to wash your hands.    GET HELP RIGHT AWAY IF: You develop worsening symptoms; breathlessness at rest, drowsy, confused or agitated, unable to speak in full sentences You have coughing fits You develop a severe headache or visual changes You develop shortness of breath, difficulty breathing or start having chest pain Your symptoms persist after you have completed your treatment plan If your symptoms do not improve within 10 days  MAKE SURE YOU Understand these instructions. Will watch your condition. Will get help right away if you are not doing well or get worse.   Your e-visit answers were reviewed by a board certified advanced clinical practitioner to complete your personal care plan, Depending upon the condition, your plan could have included both over the counter or prescription medications.   Please review your pharmacy choice. Your safety is important to us. If you have drug allergies check your prescription carefully.  You can use MyChart to ask questions about today's visit, request a non-urgent  call back, or ask for a work or school excuse for 24 hours related to this e-Visit. If it has been greater than 24 hours you will need to follow up with your provider, or enter a new e-Visit to address those concerns.   You will get an e-mail in the next two days asking about your experience. I hope that your e-visit has been valuable and will speed your recovery. Thank you for using e-visits.   Approximately 5 minutes was spent documenting and reviewing patient's chart.    was spent documenting and reviewing patient's chart.

## 2020-12-27 DIAGNOSIS — C159 Malignant neoplasm of esophagus, unspecified: Secondary | ICD-10-CM | POA: Diagnosis not present

## 2021-05-22 NOTE — Patient Instructions (Signed)
Thank you for choosing [facility] for your Primary Care needs. I am excited for the opportunity to partner with you to meet your health care goals. It was a pleasure meeting you today!  Recommendations from today's visit: We will get labs today for evaluation. I will be in contact with you about the results and any further recommendations.  Our records indicate you are due for a tetanus vaccine. If you have not had this in the last 10 years, we can provide that for you.   Information on diet, exercise, and health maintenance recommendations are listed below. This is information to help you be sure you are on track for optimal health and monitoring.   Please look over this and let us know if you have any questions or if you have completed any of the health maintenance outside of [facility] so that we can be sure your records are up to date.  ___________________________________________________________ About Me: I am an Adult-Geriatric Nurse Practitioner with a background in caring for patients for more than 20 years with a strong intensive care background. I provide primary care and sports medicine services to patients age 69 and older within this office. My education had a strong focus on caring for the older adult population, which I am passionate about. I am also the director of the APP Fellowship with [facility].   My desire is to provide you with the best service through preventive medicine and supportive care. I consider you a part of the medical team and value your input. I work diligently to ensure that you are heard and your needs are met in a safe and effective manner. I want you to feel comfortable with me as your provider and want you to know that your health concerns are important to me.  For your information, our office hours are: Monday, Tuesday, and Thursday 8:00 AM - 5:00 PM Wednesday and Friday 8:00 AM - 12:00 PM.   In my time away from the  office I am teaching new APP's within the system and am unavailable, but my partner, Dr. Burnard Bunting is in the office for emergent needs.   If you have questions or concerns, please call our office at [phone] or send [place] a MyChart message and we will respond as quickly as possible.  ____________________________________________________________ MyChart:  For all urgent or time sensitive needs we ask that you please call the office to avoid delays. Our number is [phone]. MyChart is not constantly monitored and due to the large volume of messages a day, replies may take up to 72 business hours.  MyChart Policy: MyChart allows for you to see your visit notes, after visit summary, provider recommendations, lab and tests results, make an appointment, request refills, and contact your provider or the office for non-urgent questions or concerns. Providers are seeing patients during normal business hours and do not have built in time to review MyChart messages.  We ask that you allow a minimum of 3 business days for responses to [employer]. For this reason, please do not send urgent requests through [place]. Please call the office at [phone]. New and ongoing conditions may require a visit. We have virtual and in person visit available for your convenience.  Complex MyChart concerns may require a visit. Your provider may request you schedule a virtual or in person visit to ensure we are providing the best care possible. MyChart messages sent after 11:00 AM on Friday will not be received  by the provider until Monday morning.    Lab and Test Results: You will receive your lab and test results on MyChart as soon as they are completed and results have been sent by the lab or testing facility. Due to this service, you will receive your results BEFORE your provider.  I review lab and tests results each morning prior to seeing patients. Some results require collaboration with other providers to  ensure you are receiving the most appropriate care. For this reason, we ask that you please allow a minimum of 3-5 business days from the time the ALL results have been received for your provider to receive and review lab and test results and contact you about these.  Most lab and test result comments from the provider will be sent through [place]. Your provider may recommend changes to the plan of care, follow-up visits, repeat testing, ask questions, or request an office visit to discuss these results. You may reply directly to this message or call the office at [phone] to provide information for the provider or set up an appointment. In some instances, you will be called with test results and recommendations. Please let us know if this is preferred and we will make note of this in your chart to provide this for you.    If you have not heard a response to your lab or test results in 5 business days from all results returning to [place], please call the office to let us know. We ask that you please avoid calling prior to this time unless there is an emergent concern. Due to high call volumes, this can delay the resulting process.  After Hours: For all non-emergency after hours needs, please call the office at [phone] and select the option to reach the on-call provider service. On-call services are shared between multiple [facility] offices and therefore it will not be possible to speak directly with your provider. On-call providers may provide medical advice and recommendations, but are unable to provide refills for maintenance medications.  For all emergency or urgent medical needs after normal business hours, we recommend that you seek care at the closest Urgent Care or Emergency Department to ensure appropriate treatment in a timely manner.  [facility] [place] has a 24 hour emergency room located on the ground floor for your convenience.   Urgent Concerns During the Business  Day Providers are seeing patients from 8AM to [place] with a busy schedule and are most often not able to respond to non-urgent calls until the end of the day or the next business day. If you should have URGENT concerns during the day, please call and speak to the nurse or schedule a same day appointment so that we can address your concern without delay.   Thank you, again, for choosing me as your health care partner. I appreciate your trust and look forward to learning more about you.   Worthy Keeler, DNP, AGNP-c ___________________________________________________________  Health Maintenance Recommendations Screening Testing Mammogram Every 1 -2 years based on history and risk factors Starting at age 42 Pap Smear Ages 21-39 every 3 years Ages 69-65 every 5 years with HPV testing More frequent testing may be required based on results and history Colon Cancer Screening Every 1-10 years based on test performed, risk factors, and history Starting at age 21 Bone Density Screening Every 2-10 years based on history Starting at age 22 for women Recommendations for men differ based on medication usage, history, and risk factors AAA Screening  One time ultrasound Men 71-47 years old who have every smoked Lung Cancer Screening Low Dose Lung CT every 12 months Age 54-80 years with a 30 pack-year smoking history who still smoke or who have quit within the last 15 years  Screening Labs Routine  Labs: Complete Blood Count (CBC), Complete Metabolic Panel (CMP), Cholesterol (Lipid Panel) Every 6-12 months based on history and medications May be recommended more frequently based on current conditions or previous results Hemoglobin A1c Lab Every 3-12 months based on history and previous results Starting at age 68 or earlier with diagnosis of diabetes, high cholesterol, BMI >26, and/or risk factors Frequent monitoring for patients with diabetes to ensure blood sugar control Thyroid Panel (TSH w/ T3  & T4) Every 6 months based on history, symptoms, and risk factors May be repeated more often if on medication HIV One time testing for all patients 18 and older May be repeated more frequently for patients with increased risk factors or exposure Hepatitis C One time testing for all patients 82 and older May be repeated more frequently for patients with increased risk factors or exposure Gonorrhea, Chlamydia Every 12 months for all sexually active persons 13-24 years Additional monitoring may be recommended for those who are considered high risk or who have symptoms PSA Men 15-98 years old with risk factors Additional screening may be recommended from age 16-69 based on risk factors, symptoms, and history  Vaccine Recommendations Tetanus Booster All adults every 10 years Flu Vaccine All patients 6 months and older every year COVID Vaccine All patients 12 years and older Initial dosing with booster May recommend additional booster based on age and health history HPV Vaccine 2 doses all patients age 62-26 Dosing may be considered for patients over 26 Shingles Vaccine (Shingrix) 2 doses all adults 71 years and older Pneumonia (Pneumovax 23) All adults 3 years and older May recommend earlier dosing based on health history Pneumonia (Prevnar 52) All adults 49 years and older Dosed 1 year after Pneumovax 23  Additional Screening, Testing, and Vaccinations may be recommended on an individualized basis based on family history, health history, risk factors, and/or exposure.  __________________________________________________________  Diet Recommendations for All Patients  I recommend that all patients maintain a diet low in saturated fats, carbohydrates, and cholesterol. While this can be challenging at first, it is not impossible and small changes can make big differences.  Things to try: Decreasing the amount of soda, sweet tea, and/or juice to one or less per day and replace with  water While water is always the first choice, if you do not like water you may consider adding a water additive without sugar to improve the taste other sugar free drinks Replace potatoes with a brightly colored vegetable at dinner Use healthy oils, such as canola oil or olive oil, instead of butter or hard margarine Limit your bread intake to two pieces or less a day Replace regular pasta with low carb pasta options Bake, broil, or grill foods instead of frying Monitor portion sizes  Eat smaller, more frequent meals throughout the day instead of large meals  An important thing to remember is, if you love foods that are not great for your health, you don't have to give them up completely. Instead, allow these foods to be a reward when you have done well. Allowing yourself to still have special treats every once in a while is a nice way to tell yourself thank you for working hard to keep yourself healthy.  Also remember that every day is a new day. If you have a bad day and "fall off the wagon", you can still climb right back up and keep moving along on your journey!  We have resources available to help you!  Some websites that may be helpful include: www.http://carter.biz/  Www.VeryWellFit.com _____________________________________________________________  Activity Recommendations for All Patients  I recommend that all adults get at least 20 minutes of moderate physical activity that elevates your heart rate at least 5 days out of the week.  Some examples include: Walking or jogging at a pace that allows you to carry on a conversation Cycling (stationary bike or outdoors) Water aerobics Yoga Weight lifting Dancing If physical limitations prevent you from putting stress on your joints, exercise in a pool or seated in a chair are excellent options.  Do determine your MAXIMUM heart rate for activity: YOUR AGE - 220 = MAX HeartRate   Remember! Do not push yourself too hard.  Start slowly  and build up your pace, speed, weight, time in exercise, etc.  Allow your body to rest between exercise and get good sleep. You will need more water than normal when you are exerting yourself. Do not wait until you are thirsty to drink. Drink with a purpose of getting in at least 8, 8 ounce glasses of water a day plus more depending on how much you exercise and sweat.    If you begin to develop dizziness, chest pain, abdominal pain, jaw pain, shortness of breath, headache, vision changes, lightheadedness, or other concerning symptoms, stop the activity and allow your body to rest. If your symptoms are severe, seek emergency evaluation immediately. If your symptoms are concerning, but not severe, please let us know so that we can recommend further evaluation.

## 2021-05-22 NOTE — Progress Notes (Signed)
Orma Render, DNP, AGNP-c [facility] [address]  [place], [place] [address] [phone]  New patient visit   Patient: Alfred Delgado   DOB: [birth year]   60 y.o. Male  MRN: [id number] Visit Date: 05/23/2021  Patient Care Team: Jaking Thayer, Coralee Pesa, NP as PCP - General (Nurse Practitioner)  Today's healthcare provider: Orma Render, NP   Chief Complaint  Patient presents with   Establish Care    Patient is here to establish care. He would like a complete blood panel drawing w/Ferritin, Serum Iron, TIBC. Over all he feels well. No refills are needed for todays appointment.   Subjective    Derron Pipkins is a 60 y.o. male who presents today as a new patient to establish care.  HPI HPI     Establish Care    Additional comments: Patient is here to establish care. He would like a complete blood panel drawing w/Ferritin, Serum Iron, TIBC. Over all he feels well. No refills are needed for todays appointment.      Last edited by Pennelope Bracken on 05/23/2021 10:53 AM.      Mortimer Fries is a pleasant 60 year old male with a history of lower extremity DVT, colon polyps, diverticulitis, and esophageal cancer. He has undergone surgery, radiation, and chemotherapy treatments for esophageal chain cancer approximately 10 years ago and is currently no longer requiring screening.  His most recent scan was within the last couple months and he reports he no longer needs to return for further evaluation unless he notices changes. Lower extremity DVT in the left calf following surgery for esophageal cancer.  He does endorse "phantom like" pain occasionally in the area but no redness, erythema, swelling, warmth present.  He is able to ambulate without concerns.  He states that this has been ongoing for the past 10 years and feels it is likely related to the history of DVT present.  He is in need of a physical exam today for work.  He will bring the physical  form in with him tomorrow to be completed.  He endorses no significant medical's concerns today.  He tells me he does see an acupuncturist routinely at [facility] in [place] and he is taking recommended vitamins and minerals from that provider.  At this time he is not taking any other routine oral medications.  Past Medical History:  Diagnosis Date   Allergy    Anemia    Asthma    BELCHING 07/23/2010   Qualifier: Diagnosis of  By: Asa Lente MD, Jannifer Rodney    CHEST PAIN 06/25/2010   Qualifier: Diagnosis of  By: Asa Lente MD, Mateo Flow A    DVT of lower extremity (deep venous thrombosis) (HCC)    History of colon polyps    History of colonic diverticulitis    LUQ PAIN 06/25/2010   Qualifier: Diagnosis of  By: Asa Lente MD, Mateo Flow A    Multiple lipomas    History of   NEOPLASM, MALIGNANT, ESOPHAGUS 08/30/2010   Past Surgical History:  Procedure Laterality Date   COLONOSCOPY  last 2013   ESOPHAGOGASTRECTOMY  2012   Dr. Carlye Grippe   POLYPECTOMY     PORTA CATH REMOVAL     PORTACATH PLACEMENT  03/14/2011   TONSILLECTOMY     UPPER GASTROINTESTINAL ENDOSCOPY     Family Status  Relation Name Status   Mother  Alive   Father  Deceased   MGM  Deceased   MGF  Deceased  PGM  Deceased   PGF  Deceased   Neg Hx  (Not Specified)   Family History  Problem Relation Age of Onset   Breast cancer Mother    Thyroid disease Mother    Throat cancer Father    Colon polyps Father    Esophageal cancer Father    Thyroid disease Maternal Grandmother    Cancer Paternal Grandmother    Heart disease Paternal Grandfather    Colon cancer Neg Hx    Rectal cancer Neg Hx    Stomach cancer Neg Hx    Social History   Socioeconomic History   Marital status: Married    Spouse name: Not on file   Number of children: 0   Years of education: 12   Highest education level: Not on file  Occupational History   Occupation: Conservation officer, nature   Tobacco Use   Smoking status: Former     Packs/day: 1.50    Years: 20.00    Pack years: 30.00    Types: Cigarettes    Quit date: 07/08/1997    Years since quitting: 23.8   Smokeless tobacco: Never  Vaping Use   Vaping Use: Never used  Substance and Sexual Activity   Alcohol use: No    Alcohol/week: 0.0 standard drinks   Drug use: No   Sexual activity: Not on file  Other Topics Concern   Not on file  Social History Narrative   Fun: Motorcycle, walk, church, pottery, bass guitar   Denies religious beliefs effecting health care.    Social Determinants of Health   Financial Resource Strain: Not on file  Food Insecurity: Not on file  Transportation Needs: Not on file  Physical Activity: Not on file  Stress: Not on file  Social Connections: Not on file   Outpatient Medications Prior to Visit  Medication Sig   albuterol (VENTOLIN HFA) 108 (90 Base) MCG/ACT inhaler Inhale 2 puffs into the lungs every 6 (six) hours as needed for wheezing or shortness of breath.   Ascorbic Acid (VITAMIN C PO) Take by mouth.   Cholecalciferol (VITAMIN D PO) Take by mouth.   Cholecalciferol (VITAMIN D3) 100000 UNIT/GM POWD Take by mouth.   Cyanocobalamin (VITAMIN B-12 PO) Take by mouth.   Glucosamine HCl (GLUCOSAMINE PO) Take by mouth.   IRON PO Take by mouth.   Methylsulfonylmethane (MSM PO) Take by mouth.   Probiotic Product (PROBIOTIC PO) Take by mouth.   TURMERIC PO Take by mouth.   No facility-administered medications prior to visit.   No Known Allergies  Immunization History  Administered Date(s) Administered   Td 05/18/2010    Health Maintenance  Topic Date Due   COVID-19 Vaccine (1) Never done   Pneumococcal Vaccine 19-12 Years old (1 - PCV) Never done   HIV Screening  Never done   Hepatitis C Screening  Never done   Zoster Vaccines- Shingrix (1 of 2) Never done   TETANUS/TDAP  05/18/2020   INFLUENZA VACCINE  Never done   COLONOSCOPY (Pts 45-23yrs Insurance coverage will need to be confirmed)  05/07/2028   HPV VACCINES   Aged Out    Patient Care Team: Shefali Ng, Coralee Pesa, NP as PCP - General (Nurse Practitioner)  Review of Systems All review of systems negative except what is listed in the HPI    Objective    BP 122/82   Pulse 81   Ht 6\' 2"  (1.88 m)   Wt 211 lb (95.7 kg)   SpO2 97%   BMI  27.09 kg/m  Physical Exam Vitals and nursing note reviewed.  Constitutional:      General: He is not in acute distress.    Appearance: Normal appearance.  HENT:     Head: Normocephalic and atraumatic.     Right Ear: Hearing, tympanic membrane, ear canal and external ear normal.     Left Ear: Hearing, tympanic membrane, ear canal and external ear normal.     Nose: Nose normal.     Right Sinus: No maxillary sinus tenderness or frontal sinus tenderness.     Left Sinus: No maxillary sinus tenderness or frontal sinus tenderness.     Mouth/Throat:     Lips: Pink.     Mouth: Mucous membranes are moist.     Pharynx: Oropharynx is clear.  Eyes:     General: Lids are normal. Vision grossly intact.     Extraocular Movements: Extraocular movements intact.     Conjunctiva/sclera: Conjunctivae normal.     Pupils: Pupils are equal, round, and reactive to light.     Funduscopic exam:    Right eye: No hemorrhage. Red reflex present.        Left eye: No hemorrhage. Red reflex present.    Visual Fields: Right eye visual fields normal and left eye visual fields normal.  Neck:     Thyroid: No thyromegaly.     Vascular: No carotid bruit or JVD.  Cardiovascular:     Rate and Rhythm: Normal rate and regular rhythm.     Chest Wall: PMI is not displaced.     Pulses: Normal pulses.          Dorsalis pedis pulses are 2+ on the right side and 2+ on the left side.       Posterior tibial pulses are 2+ on the right side and 2+ on the left side.     Heart sounds: Normal heart sounds. No murmur heard. Pulmonary:     Effort: Pulmonary effort is normal. No respiratory distress.     Breath sounds: Normal breath sounds.  Chest:   Breasts:    Breasts are symmetrical.  Abdominal:     General: Bowel sounds are normal. There is no distension or abdominal bruit.     Palpations: Abdomen is soft. There is no hepatomegaly, splenomegaly or mass.     Tenderness: There is no abdominal tenderness. There is no right CVA tenderness, left CVA tenderness, guarding or rebound.  Musculoskeletal:        General: Normal range of motion.     Cervical back: Full passive range of motion without pain and neck supple. No tenderness. No spinous process tenderness or muscular tenderness.     Right lower leg: No edema.     Left lower leg: No edema.  Feet:     Right foot:     Toenail Condition: Right toenails are normal.     Left foot:     Toenail Condition: Left toenails are normal.  Lymphadenopathy:     Cervical: No cervical adenopathy.     Upper Body:     Right upper body: No supraclavicular adenopathy.     Left upper body: No supraclavicular adenopathy.  Skin:    General: Skin is warm and dry.     Capillary Refill: Capillary refill takes less than 2 seconds.     Nails: There is no clubbing.  Neurological:     General: No focal deficit present.     Mental Status: He is alert and oriented to person,  place, and time.     Cranial Nerves: No cranial nerve deficit.     Sensory: Sensation is intact. No sensory deficit.     Motor: Motor function is intact. No weakness.     Coordination: Coordination is intact. Coordination normal.     Gait: Gait is intact. Gait normal.  Psychiatric:        Attention and Perception: Attention normal.        Mood and Affect: Mood normal.        Speech: Speech normal.        Behavior: Behavior normal. Behavior is cooperative.        Thought Content: Thought content normal.        Cognition and Memory: Cognition and memory normal.        Judgment: Judgment normal.   Depression Screen PHQ 2/9 Scores 05/23/2021 07/08/2011  PHQ - 2 Score 2 0  PHQ- 9 Score 5 -   No results found for any visits on  05/23/21.  Assessment & Plan      Problem List Items Addressed This Visit     History of esophageal cancer    No current concerns or complaints.   He is status postchemotherapy and radiation therapy as well as surgical removal of part of the esophagus with reanastomosis.  He was recently scanned by oncology and found to be doing well and no longer needs routine follow-ups for this.      Screening for endocrine, nutritional, metabolic and immunity disorder - Primary   Relevant Orders   CBC with Differential/Platelet   Comprehensive metabolic panel   Lipid panel   Hemoglobin A1c   PSA Total (Reflex To Free)   Fe+TIBC+Fer   Hx of iron deficiency anemia    He has a personal history of iron deficiency anemia for which she is taking the supplementation over-the-counter. He would like to have his ferritin and iron and TIBC measured today.      Relevant Orders   Fe+TIBC+Fer   Positive depression screening    PHQ and GAD screening scores are positive today.  Discussed symptoms with patient and he endorses that these are lifelong symptoms are not concerning or new.  He does not feel that they interrupt with his normal activities of daily living or affect him in any way negatively. Discussed with the patient that if his symptoms are worsening or become bothersome at any point we can consider counseling or possible medication management.  He is agreeable to contact me if symptoms worsen or become bothersome      Other Visit Diagnoses     Need for shingles vaccine       Relevant Medications   Zoster Vaccine Adjuvanted [facility]) injection        Return in about 1 year (around 05/23/2022) for CPE today- CPE in 1 year. Orma Render, NP, DNP, AGNP-C [facility] at [place]

## 2021-05-23 ENCOUNTER — Other Ambulatory Visit: Payer: Self-pay

## 2021-05-23 ENCOUNTER — Ambulatory Visit (INDEPENDENT_AMBULATORY_CARE_PROVIDER_SITE_OTHER): Payer: BC Managed Care – PPO | Admitting: Nurse Practitioner

## 2021-05-23 ENCOUNTER — Encounter (HOSPITAL_BASED_OUTPATIENT_CLINIC_OR_DEPARTMENT_OTHER): Payer: Self-pay | Admitting: Nurse Practitioner

## 2021-05-23 VITALS — BP 122/82 | HR 81 | Ht 74.0 in | Wt 211.0 lb

## 2021-05-23 DIAGNOSIS — Z1329 Encounter for screening for other suspected endocrine disorder: Secondary | ICD-10-CM | POA: Diagnosis not present

## 2021-05-23 DIAGNOSIS — Z13228 Encounter for screening for other metabolic disorders: Secondary | ICD-10-CM

## 2021-05-23 DIAGNOSIS — Z862 Personal history of diseases of the blood and blood-forming organs and certain disorders involving the immune mechanism: Secondary | ICD-10-CM | POA: Diagnosis not present

## 2021-05-23 DIAGNOSIS — Z0000 Encounter for general adult medical examination without abnormal findings: Secondary | ICD-10-CM

## 2021-05-23 DIAGNOSIS — Z130 Encounter for screening for diseases of the blood and blood-forming organs and certain disorders involving the immune mechanism: Secondary | ICD-10-CM

## 2021-05-23 DIAGNOSIS — Z1321 Encounter for screening for nutritional disorder: Secondary | ICD-10-CM

## 2021-05-23 DIAGNOSIS — Z23 Encounter for immunization: Secondary | ICD-10-CM | POA: Diagnosis not present

## 2021-05-23 DIAGNOSIS — Z8501 Personal history of malignant neoplasm of esophagus: Secondary | ICD-10-CM

## 2021-05-23 DIAGNOSIS — Z1331 Encounter for screening for depression: Secondary | ICD-10-CM | POA: Insufficient documentation

## 2021-05-23 MED ORDER — ZOSTER VAC RECOMB ADJUVANTED 50 MCG/0.5ML IM SUSR: 0.5000 mL | Freq: Once | INTRAMUSCULAR | 1 refills | Status: AC

## 2021-05-23 NOTE — Assessment & Plan Note (Signed)
He has a personal history of iron deficiency anemia for which she is taking the supplementation over-the-counter. He would like to have his ferritin and iron and TIBC measured today.

## 2021-05-23 NOTE — Assessment & Plan Note (Signed)
No current concerns or complaints.   He is status postchemotherapy and radiation therapy as well as surgical removal of part of the esophagus with reanastomosis.  He was recently scanned by oncology and found to be doing well and no longer needs routine follow-ups for this.

## 2021-05-23 NOTE — Assessment & Plan Note (Signed)
PHQ and GAD screening scores are positive today.  Discussed symptoms with patient and he endorses that these are lifelong symptoms are not concerning or new.  He does not feel that they interrupt with his normal activities of daily living or affect him in any way negatively. Discussed with the patient that if his symptoms are worsening or become bothersome at any point we can consider counseling or possible medication management.  He is agreeable to contact me if symptoms worsen or become bothersome

## 2021-05-24 ENCOUNTER — Ambulatory Visit (HOSPITAL_BASED_OUTPATIENT_CLINIC_OR_DEPARTMENT_OTHER): Payer: BC Managed Care – PPO | Admitting: Nurse Practitioner

## 2021-05-24 LAB — COMPREHENSIVE METABOLIC PANEL
ALT: 15 IU/L (ref 0–44)
AST: 25 IU/L (ref 0–40)
Albumin/Globulin Ratio: 2.3 — ABNORMAL HIGH (ref 1.2–2.2)
Albumin: 4.8 g/dL (ref 3.8–4.9)
Alkaline Phosphatase: 73 IU/L (ref 44–121)
BUN/Creatinine Ratio: 13 (ref 9–20)
BUN: 11 mg/dL (ref 6–24)
Bilirubin Total: 0.2 mg/dL (ref 0.0–1.2)
CO2: 25 mmol/L (ref 20–29)
Calcium: 8.9 mg/dL (ref 8.7–10.2)
Chloride: 97 mmol/L (ref 96–106)
Creatinine, Ser: 0.84 mg/dL (ref 0.76–1.27)
Globulin, Total: 2.1 g/dL (ref 1.5–4.5)
Glucose: 102 mg/dL — ABNORMAL HIGH (ref 70–99)
Potassium: 4.8 mmol/L (ref 3.5–5.2)
Sodium: 134 mmol/L (ref 134–144)
Total Protein: 6.9 g/dL (ref 6.0–8.5)
eGFR: 100 mL/min/{1.73_m2} (ref >59)

## 2021-05-24 LAB — IRON,TIBC AND FERRITIN PANEL
Ferritin: 30 ng/mL (ref 30–400)
Iron Saturation: 23 % (ref 15–55)
Iron: 84 ug/dL (ref 38–169)
Total Iron Binding Capacity: 372 ug/dL (ref 250–450)
UIBC: 288 ug/dL (ref 111–343)

## 2021-05-24 LAB — CBC WITH DIFFERENTIAL/PLATELET
Basophils Absolute: 0.1 10*3/uL (ref 0.0–0.2)
Basos: 2 %
EOS (ABSOLUTE): 0.1 10*3/uL (ref 0.0–0.4)
Eos: 2 %
Hematocrit: 37 % — ABNORMAL LOW (ref 37.5–51.0)
Hemoglobin: 12.7 g/dL — ABNORMAL LOW (ref 13.0–17.7)
Immature Grans (Abs): 0 10*3/uL (ref 0.0–0.1)
Immature Granulocytes: 0 %
Lymphocytes Absolute: 0.8 10*3/uL (ref 0.7–3.1)
Lymphs: 24 %
MCH: 31.4 pg (ref 26.6–33.0)
MCHC: 34.3 g/dL (ref 31.5–35.7)
MCV: 92 fL (ref 79–97)
Monocytes Absolute: 0.5 10*3/uL (ref 0.1–0.9)
Monocytes: 16 %
Neutrophils Absolute: 1.9 10*3/uL (ref 1.4–7.0)
Neutrophils: 56 %
Platelets: 243 10*3/uL (ref 150–450)
RBC: 4.04 x10E6/uL — ABNORMAL LOW (ref 4.14–5.80)
RDW: 12.3 % (ref 11.6–15.4)
WBC: 3.4 10*3/uL (ref 3.4–10.8)

## 2021-05-24 LAB — LIPID PANEL
Chol/HDL Ratio: 1.9 ratio (ref 0.0–5.0)
Cholesterol, Total: 177 mg/dL (ref 100–199)
HDL: 93 mg/dL (ref >39)
LDL Chol Calc (NIH): 75 mg/dL (ref 0–99)
Triglycerides: 45 mg/dL (ref 0–149)
VLDL Cholesterol Cal: 9 mg/dL (ref 5–40)

## 2021-05-24 LAB — PSA TOTAL (REFLEX TO FREE): Prostate Specific Ag, Serum: 1.9 ng/mL (ref 0.0–4.0)

## 2021-05-24 LAB — HEMOGLOBIN A1C
Est. average glucose Bld gHb Est-mCnc: 123 mg/dL
Hgb A1c MFr Bld: 5.9 % — ABNORMAL HIGH (ref 4.8–5.6)

## 2021-05-29 ENCOUNTER — Encounter (HOSPITAL_BASED_OUTPATIENT_CLINIC_OR_DEPARTMENT_OTHER): Payer: Self-pay

## 2022-12-23 ENCOUNTER — Encounter: Payer: Self-pay | Admitting: Nurse Practitioner

## 2022-12-23 ENCOUNTER — Ambulatory Visit (INDEPENDENT_AMBULATORY_CARE_PROVIDER_SITE_OTHER): Payer: 59 | Admitting: Nurse Practitioner

## 2022-12-23 VITALS — BP 114/82 | HR 69 | Ht 72.5 in | Wt 202.4 lb

## 2022-12-23 DIAGNOSIS — Z0000 Encounter for general adult medical examination without abnormal findings: Secondary | ICD-10-CM

## 2022-12-23 DIAGNOSIS — G5701 Lesion of sciatic nerve, right lower limb: Secondary | ICD-10-CM | POA: Insufficient documentation

## 2022-12-23 DIAGNOSIS — R4586 Emotional lability: Secondary | ICD-10-CM

## 2022-12-23 DIAGNOSIS — Z8501 Personal history of malignant neoplasm of esophagus: Secondary | ICD-10-CM | POA: Diagnosis not present

## 2022-12-23 DIAGNOSIS — I8390 Asymptomatic varicose veins of unspecified lower extremity: Secondary | ICD-10-CM | POA: Diagnosis not present

## 2022-12-23 DIAGNOSIS — Z862 Personal history of diseases of the blood and blood-forming organs and certain disorders involving the immune mechanism: Secondary | ICD-10-CM | POA: Diagnosis not present

## 2022-12-23 NOTE — Assessment & Plan Note (Signed)
Monitoring today

## 2022-12-23 NOTE — Assessment & Plan Note (Signed)
CPE completed today.   Labs ordered. Will make changes as necessary based on results.  Review of HM activities and recommendations discussed and provided on AVS Anticipatory guidance, diet, and exercise recommendations provided.  Medications, allergies, and hx reviewed and updated as necessary.  Plan to f/u with CPE in 1 year or sooner for acute/chronic health needs as directed.   

## 2022-12-23 NOTE — Patient Instructions (Addendum)
Piriformis Syndrome  Piriformis syndrome is a condition that can cause pain and numbness in your buttocks and down the back of your leg. Piriformis syndrome happens when the small muscle that connects the base of your spine to your hip (piriformis muscle) presses on the nerve that runs down the back of your leg (sciatic nerve). The piriformis muscle helps your hip rotate and helps to bring your leg back and out. It also helps shift your weight to keep you stable while you are walking. The sciatic nerve runs under or through the piriformis muscle. Damage to the piriformis muscle can cause spasms that put pressure on the nerve below. This causes pain and discomfort while sitting and moving. The pain may feel as if it begins in the buttock and spreads (radiates) down your hip and thigh. What are the causes? This condition is caused by pressure on the sciatic nerve from the piriformis muscle. The piriformis muscle can get irritated with overuse, especially if other hip muscles are weak and the piriformis muscle has to do extra work. Piriformis syndrome can also occur after an injury, like a fall onto your buttocks. What increases the risk? You are more likely to develop this condition if you: Are a woman. Sit for long periods of time. Are a cyclist. Have weak buttocks muscles (gluteal muscles). What are the signs or symptoms? Symptoms of this condition include: Pain, tingling, or numbness that starts in the buttock and runs down the back of your leg (sciatica). Pain in the groin or thigh area. Your symptoms may get worse: The longer you sit. When you walk, run, or climb stairs. When straining to have a bowel movement. How is this diagnosed? This condition is diagnosed based on your symptoms, medical history, and physical exam. During the exam, your health care provider may: Move your leg into different positions to check for pain. Press on the muscles of your hip and buttock to see if that  increases your symptoms. You may also have tests, including: Imaging tests such as X-rays, CT, MRI, or ultrasound. Electromyogram (EMG). This test measures electrical signals sent by your nerves into the muscles. Nerve conduction study. This test measures how well electrical signals pass through your nerves. How is this treated? This condition may be treated by: Stopping all activities that cause pain or make your condition worse. Applying ice or using heat therapy. Taking medicines to reduce pain and swelling. Taking a muscle relaxer (muscle relaxant) to stop muscle spasms. Doing range-of-motion and strengthening exercises (physical therapy) as told by your health care provider. Having massage, acupuncture, or local electrical stimulation (transcutaneous electrical nerve stimulation, TENS). Getting an injection of medicine in the piriformis muscle. Your health care provider will choose the medicine based on your condition. He or she may inject: An anti-inflammatory medicine (steroid) to reduce swelling. A numbing medicine (local anesthetic) to block the pain. Botulinum toxin. The toxin blocks nerve impulses to specific muscles to reduce muscle tension. In rare cases, you may need surgery to cut the muscle and release pressure on the nerve if other treatments do not work. Follow these instructions at home: Activity Do not sit for long periods. Get up and walk around every 20 minutes or as often as told by your health care provider. When driving long distances, make sure to take frequent stops to get up and stretch. Use a cushion when you sit on hard surfaces. Do exercises as told by your health care provider. Return to your normal activities as   told by your health care provider. Ask your health care provider what activities are safe for you. Managing pain, stiffness, and swelling     If directed, apply heat to the area as often as told by your health care provider. Use the heat source  that your health care provider recommends, such as a moist heat pack or a heating pad. Place a towel between your skin and the heat source. Leave the heat on for 20-30 minutes. Remove the heat if your skin turns bright red. This is especially important if you are unable to feel pain, heat, or cold. You have a greater risk of getting burned. If directed, put ice on the injured area. To do this: Put ice in a plastic bag. Place a towel between your skin and the bag. Leave the ice on for 20 minutes, 2-3 times a day. Remove the ice if your skin turns bright red. This is very important. If you cannot feel pain, heat, or cold, you have a greater risk of damage to the area. General instructions Take over-the-counter and prescription medicines only as told by your health care provider. Ask your health care provider if the medicine prescribed to you requires you to avoid driving or using machinery. You may need to take these actions to prevent or treat constipation: Drink enough fluid to keep your urine pale yellow. Take over-the-counter or prescription medicines. Eat foods that are high in fiber, such as beans, whole grains, and fresh fruits and vegetables. Limit foods that are high in fat and processed sugars, such as fried or sweet foods. Keep all follow-up visits. This is important. How is this prevented? Do not sit for longer than 20 minutes at a time. When you sit, choose padded surfaces. Warm up and stretch before being active. Cool down and stretch after being active. Contact a health care provider if: Your pain and stiffness continue or get worse. Your leg or hip becomes weak. You have changes in your bowel function or bladder function. Summary Piriformis syndrome is a condition that can cause pain, tingling, and numbness in your buttocks and down the back of your leg. You may try applying heat or ice to relieve the pain. Do not sit for long periods. Get up and walk around every 20  minutes or as often as told by your health care provider. This information is not intended to replace advice given to you by your health care provider. Make sure you discuss any questions you have with your health care provider. Document Revised: 12/18/2020 Document Reviewed: 12/18/2020 Elsevier Patient Education  2024 [employer]   Piriformis Syndrome Rehab Ask your health care provider which exercises are safe for you. Do exercises exactly as told by your health care provider and adjust them as directed. It is normal to feel mild stretching, pulling, tightness, or discomfort as you do these exercises. Stop right away if you feel sudden pain or your pain gets worse. Do not begin these exercises until told by your health care provider. Stretching and range-of-motion exercises These exercises warm up your muscles and joints and improve the movement and flexibility of your hip and pelvis. The exercises also help to relieve pain, numbness, and tingling. Nerve root  Sit on a firm surface that is high enough that you can swing your left / right foot freely. Place a folded towel under your left / right thigh. This is optional. Drop your head forward and round your back. While you keep your left / right  foot relaxed, slowly straighten your left / right knee until you feel a slight pull behind your knee or calf. If your leg is fully extended and you still do not feel a pull, slowly tilt your foot and toes toward you. Hold this position for ___15_______ seconds. Slowly return your knee to its starting position. Hip rotation This is an exercise in which you lie on your back and stretch the muscles that rotate your hip (hip rotators) to stretch your buttocks. Lie on your back on a firm surface. Pull your left / right knee toward your same shoulder with your left / right hand until your knee is pointing toward the ceiling. Hold your left / right ankle with your other hand. Keeping your knee steady,  gently pull your left / right ankle toward your other shoulder until you feel a stretch in your buttocks. Hold this position for _____15_____ seconds. Repeat ___2_______ times. Complete this exercise ____2______ times a day. Hip extensor This is an exercise in which you lie on your back and pull your knee to your chest. Lie on your back on a firm surface. Both of your legs should be straight. Pull your left / right knee to your chest. Hold your leg in this position by holding on to the back of your thigh or the front of your knee. Hold this position for ______15____ seconds. Slowly return to the starting position. Repeat ______2____ times. Complete this exercise ______2____ times a day. Strengthening exercises These exercises build strength and endurance in your hip and thigh muscles. Endurance is the ability to use your muscles for a long time, even after they get tired. Straight leg raises, side-lying This exercise strengthens the muscles that rotate the leg at the hip and move it away from your body (hip abductors). Lie on your side with your left / right leg in the top position. Lie so your head, shoulder, knee, and hip line up. Bend your bottom knee to help you balance. Lift your top leg 4-6 inches (10-15 cm) while keeping your toes pointed straight ahead. Hold this position for ____15______ seconds. Slowly lower your leg to the starting position. Let your muscles relax completely after each repetition. Repeat ____2______ times. Complete this exercise _____2_____ times a day. Hip abduction and rotation This is sometimes called quadruped (on hands and knees) exercises. Get on your hands and knees on a firm, lightly padded surface. Your hands should be directly below your shoulders, and your knees should be directly below your hips. Lift your left / right knee out to the side. Keep your knee bent. Do not twist your body. Hold this position for ______15____ seconds. Slowly lower your  leg. Repeat ____2______ times. Complete this exercise _____2_____ times a day. Straight leg raises, prone This exercise stretches the muscles that move the hips (hip extensors). Lie on your abdomen on a firm surface (prone position). Tense the muscles in your buttocks and lift your left / right leg about 4 inches (10 cm). Keep your knee straight as you lift your leg. If you cannot lift your leg that high without arching your back, place a pillow under your hips. Hold this position for _____15_____ seconds. Slowly lower your leg to the starting position. Let your muscles relax completely after each repetition. Repeat ____2______ times. Complete this exercise ______2____ times a day. This information is not intended to replace advice given to you by your health care provider. Make sure you discuss any questions you have with your health care  provider. Document Revised: 12/26/2020 Document Reviewed: 12/26/2020 Elsevier Patient Education  2024 [employer].

## 2022-12-23 NOTE — Progress Notes (Signed)
BP 114/82   Pulse 69   Ht 6' 0.5" (1.842 m)   Wt 202 lb 6.4 oz (91.8 kg)   BMI 27.07 kg/m    Subjective:    Patient ID: Alfred Delgado, male    DOB: [birth year], 62 y.o.   MRN: [id number]  HPI: Alfred Delgado is a 62 y.o. male presenting on 12/23/2022 for comprehensive medical examination.   Current medical concerns include: Mental Well-being The patient, a 62 year old male, expresses feelings of depression linked to recent life changes, including turning 61 and the loss of his mother three years ago. He mentions a lack of vision for his future, particularly concerning retirement, which he finds demotivating compared to his previous active lifestyle. He also discusses a change in his job role where he now works on [employer], which he finds occasionally boring despite his interest in airplanes.    Hip Pain The patient reports pain primarily on his right side/hip and into the buttock, which he manages by sleeping on that side and using a pillow between his knees. The pain, described as possibly muscular, has been persistent and worsens when entering a car but improves with walking. He mentions a history of the pain popping in and out over the years but notes it has been particularly bothersome recently.  Past Medical History The patient reflects on his health journey, including a past diagnosis of esophageal cancer in 2012, from which he is currently in remission. He manages residual effects like dumping syndrome, which he controls with diet, particularly noting the benefits of peanut butter in stabilizing his condition. He mentions a past issue with GERD, initially misdiagnosed before his cancer was discovered.  The patient is seeing an acupuncturist, Tonita Phoenix, regularly and taking APEX supplements.  Pertinent items are noted in HPI.  IMMUNIZATIONS:   Flu: Flu vaccine postponed until flu season Prevnar 13: Prevnar 13 N/A for this patient Pneumovax 23: Pneumovax 23 N/A for  this patient Prevnar 20: Prevnar 20 N/A for this patient HPV: HPV N/A for this patient Vac Shingrix: Shingrix declined, patient will complete at a later date Tetanus: Tetanus declined, patient will complete at a later date Covid-19 vaccine status: Declined, Education has been provided regarding the importance of this vaccine but patient still declined. Advised may receive this vaccine at local pharmacy or Health Dept.or vaccine clinic. Aware to provide a copy of the vaccination record if obtained from local pharmacy or Health Dept. Verbalized acceptance and understanding.  HEALTH MAINTENANCE: Colon Cancer Screening HM Status: is up to date STI Testing HM Status: was declined  PSA Screen HM Status: is up to date Lung Ca Screen HM Status: is not applicable for this patient AAA Screen HM Status: is not applicable for this patient  He reports regular vision exams q1-5y: Yes  He reports regular dental exams q 62m:  Yes  The patient eats a regular, healthy diet. He endorses exercise and/or activity of: Active 30 + min 4+ x/wk Mode: walking    Most Recent Depression Screen:     12/23/2022    2:29 PM 05/23/2021    2:38 PM 07/08/2011    7:40 AM  Depression screen PHQ 2/9  Decreased Interest 0 1 0  Down, Depressed, Hopeless 0 1 0  PHQ - 2 Score 0 2 0  Altered sleeping  0   Tired, decreased energy  1   Change in appetite  0   Feeling bad or failure about yourself   2   Trouble  concentrating  0   Moving slowly or fidgety/restless  0   Suicidal thoughts  0   PHQ-9 Score  5   Difficult doing work/chores  Not difficult at all    Most Recent Anxiety Screen:    05/23/2021    2:38 PM  GAD 7 : Generalized Anxiety Score  Nervous, Anxious, on Edge 0  Control/stop worrying 0  Worry too much - different things 1  Trouble relaxing 0  Restless 0  Easily annoyed or irritable 1  Afraid - awful might happen 0  Total GAD 7 Score 2  Anxiety Difficulty Not difficult at all   Most Recent Falls  Screen:    12/23/2022    2:28 PM 05/23/2021    2:38 PM  Fall Risk   Falls in the past year? 0 0  Number falls in past yr: 0 0  Injury with Fall? 0 0  Risk for fall due to : No Fall Risks No Fall Risks  Follow up Falls evaluation completed Falls evaluation completed;Education provided    Past medical history, surgical history, medications, allergies, family history and social history reviewed with patient today and changes made to appropriate areas of the chart.  Past Medical History:  Past Medical History:  Diagnosis Date   Allergy    Anemia    Asthma    BELCHING 07/23/2010   Qualifier: Diagnosis of  By: Felicity Coyer MD, Raenette Rover    CHEST PAIN 06/25/2010   Qualifier: Diagnosis of  By: Felicity Coyer MD, Vikki Ports A    DVT of lower extremity (deep venous thrombosis) (HCC)    History of colon polyps    History of colonic diverticulitis    LUQ PAIN 06/25/2010   Qualifier: Diagnosis of  By: Felicity Coyer MD, Raenette Rover    Multiple lipomas    History of   NEOPLASM, MALIGNANT, ESOPHAGUS 08/30/2010   Medications:  Current Outpatient Medications on File Prior to Visit  Medication Sig   albuterol (VENTOLIN HFA) 108 (90 Base) MCG/ACT inhaler Inhale 2 puffs into the lungs every 6 (six) hours as needed for wheezing or shortness of breath.   Cholecalciferol (VITAMIN D PO) Take by mouth.   Cholecalciferol (VITAMIN D3) 100000 UNIT/GM POWD Take by mouth.   IRON PO Take by mouth.   Methylsulfonylmethane (MSM PO) Take by mouth.   No current facility-administered medications on file prior to visit.   Surgical History:  Past Surgical History:  Procedure Laterality Date   COLONOSCOPY  last 2013   ESOPHAGOGASTRECTOMY  2012   Dr. Arbie Cookey   POLYPECTOMY     PORTA CATH REMOVAL     PORTACATH PLACEMENT  03/14/2011   TONSILLECTOMY     UPPER GASTROINTESTINAL ENDOSCOPY     Allergies:  No Known Allergies Social History:  Social History   Socioeconomic History   Marital status: Married    Spouse name:  Not on file   Number of children: 0   Years of education: 12   Highest education level: Not on file  Occupational History   Occupation: Barrister's clerk   Tobacco Use   Smoking status: Former    Packs/day: 1.50    Years: 20.00    Additional pack years: 0.00    Total pack years: 30.00    Types: Cigarettes    Quit date: 07/08/1997    Years since quitting: 25.4   Smokeless tobacco: Never  Vaping Use   Vaping Use: Never used  Substance and Sexual Activity   Alcohol use: No  Alcohol/week: 0.0 standard drinks of alcohol   Drug use: No   Sexual activity: Not on file  Other Topics Concern   Not on file  Social History Narrative   Fun: Motorcycle, walk, church, pottery, bass guitar   Denies religious beliefs effecting health care.    Social Determinants of Health   Financial Resource Strain: Not on file  Food Insecurity: Not on file  Transportation Needs: Not on file  Physical Activity: Not on file  Stress: Not on file  Social Connections: Not on file  Intimate Partner Violence: Not on file   Social History   Tobacco Use  Smoking Status Former   Packs/day: 1.50   Years: 20.00   Additional pack years: 0.00   Total pack years: 30.00   Types: Cigarettes   Quit date: 07/08/1997   Years since quitting: 25.4  Smokeless Tobacco Never   Social History   Substance and Sexual Activity  Alcohol Use No   Alcohol/week: 0.0 standard drinks of alcohol   Family History:  Family History  Problem Relation Age of Onset   Breast cancer Mother    Thyroid disease Mother    Throat cancer Father    Colon polyps Father    Esophageal cancer Father    Thyroid disease Maternal Grandmother    Cancer Paternal Grandmother    Heart disease Paternal Grandfather    Colon cancer Neg Hx    Rectal cancer Neg Hx    Stomach cancer Neg Hx        Objective:    BP 114/82   Pulse 69   Ht 6' 0.5" (1.842 m)   Wt 202 lb 6.4 oz (91.8 kg)   BMI 27.07 kg/m   Wt Readings from Last 3  Encounters:  12/23/22 202 lb 6.4 oz (91.8 kg)  05/23/21 211 lb (95.7 kg)  05/07/18 198 lb (89.8 kg)    Physical Exam Constitutional:      Appearance: He is well-developed.  HENT:     Head: Normocephalic and atraumatic.     Right Ear: Tympanic membrane, ear canal and external ear normal.     Left Ear: Tympanic membrane, ear canal and external ear normal.     Nose: Nose normal.     Mouth/Throat:     Pharynx: Oropharynx is clear.  Eyes:     Conjunctiva/sclera: Conjunctivae normal.     Pupils: Pupils are equal, round, and reactive to light.  Neck:     Thyroid: No thyromegaly.  Cardiovascular:     Rate and Rhythm: Normal rate and regular rhythm.     Heart sounds: Normal heart sounds.  Pulmonary:     Effort: Pulmonary effort is normal.     Breath sounds: Normal breath sounds.  Abdominal:     General: Bowel sounds are normal. There is no distension.     Palpations: Abdomen is soft. There is no mass.     Tenderness: There is no abdominal tenderness. There is no guarding or rebound.  Musculoskeletal:        General: Normal range of motion.     Cervical back: Normal range of motion and neck supple. No tenderness.  Lymphadenopathy:     Cervical: No cervical adenopathy.  Skin:    General: Skin is warm and dry.     Comments: Nodule 1cm on the anterior portion of the right ankle.   Neurological:     Mental Status: He is alert and oriented to person, place, and time.  Deep Tendon Reflexes: Reflexes are normal and symmetric.  Psychiatric:        Behavior: Behavior normal.        Thought Content: Thought content normal.        Judgment: Judgment normal.     Results for orders placed or performed in visit on 05/23/21  CBC with Differential/Platelet  Result Value Ref Range   WBC 3.4 3.4 - 10.8 x10E3/uL   RBC 4.04 (L) 4.14 - 5.80 x10E6/uL   Hemoglobin 12.7 (L) 13.0 - 17.7 g/dL   Hematocrit 16.1 (L) 09.6 - 51.0 %   MCV 92 79 - 97 fL   MCH 31.4 26.6 - 33.0 pg   MCHC 34.3 31.5 -  35.7 g/dL   RDW 04.5 40.9 - 81.1 %   Platelets 243 150 - 450 x10E3/uL   Neutrophils 56 Not Estab. %   Lymphs 24 Not Estab. %   Monocytes 16 Not Estab. %   Eos 2 Not Estab. %   Basos 2 Not Estab. %   Neutrophils Absolute 1.9 1.4 - 7.0 x10E3/uL   Lymphocytes Absolute 0.8 0.7 - 3.1 x10E3/uL   Monocytes Absolute 0.5 0.1 - 0.9 x10E3/uL   EOS (ABSOLUTE) 0.1 0.0 - 0.4 x10E3/uL   Basophils Absolute 0.1 0.0 - 0.2 x10E3/uL   Immature Granulocytes 0 Not Estab. %   Immature Grans (Abs) 0.0 0.0 - 0.1 x10E3/uL  Comprehensive metabolic panel  Result Value Ref Range   Glucose 102 (H) 70 - 99 mg/dL   BUN 11 6 - 24 mg/dL   Creatinine, Ser 9.14 0.76 - 1.27 mg/dL   eGFR 782 >95 AO/ZHY/8.65   BUN/Creatinine Ratio 13 9 - 20   Sodium 134 134 - 144 mmol/L   Potassium 4.8 3.5 - 5.2 mmol/L   Chloride 97 96 - 106 mmol/L   CO2 25 20 - 29 mmol/L   Calcium 8.9 8.7 - 10.2 mg/dL   Total Protein 6.9 6.0 - 8.5 g/dL   Albumin 4.8 3.8 - 4.9 g/dL   Globulin, Total 2.1 1.5 - 4.5 g/dL   Albumin/Globulin Ratio 2.3 (H) 1.2 - 2.2   Bilirubin Total 0.2 0.0 - 1.2 mg/dL   Alkaline Phosphatase 73 44 - 121 IU/L   AST 25 0 - 40 IU/L   ALT 15 0 - 44 IU/L  Lipid panel  Result Value Ref Range   Cholesterol, Total 177 100 - 199 mg/dL   Triglycerides 45 0 - 149 mg/dL   HDL 93 >78 mg/dL   VLDL Cholesterol Cal 9 5 - 40 mg/dL   LDL Chol Calc (NIH) 75 0 - 99 mg/dL   Chol/HDL Ratio 1.9 0.0 - 5.0 ratio  Hemoglobin A1c  Result Value Ref Range   Hgb A1c MFr Bld 5.9 (H) 4.8 - 5.6 %   Est. average glucose Bld gHb Est-mCnc 123 mg/dL  PSA Total (Reflex To Free)  Result Value Ref Range   Prostate Specific Ag, Serum 1.9 0.0 - 4.0 ng/mL   Reflex Criteria Comment   Fe+TIBC+Fer  Result Value Ref Range   Total Iron Binding Capacity 372 250 - 450 ug/dL   UIBC 469 629 - 528 ug/dL   Iron 84 38 - 413 ug/dL   Iron Saturation 23 15 - 55 %   Ferritin 30 30 - 400 ng/mL      Assessment & Plan:   Problem List Items Addressed This  Visit     History of esophageal cancer    Patient is a 12-year survivor  of esophageal cancer, currently experiencing symptoms consistent with dumping syndrome, particularly triggered by sugar intake. He manages these episodes with dietary adjustments, including the intake of protein to stabilize blood sugar levels. Plan: - Continue dietary management as currently practiced.  - Patient uses peanut butter and crackers to mitigate symptoms effectively. - Advised to monitor symptoms and adjust diet as necessary to manage dumping syndrome.      Relevant Orders   CBC with Differential/Platelet   Hemoglobin A1c   TSH   Lipid panel   Comprehensive metabolic panel   Iron, TIBC and Ferritin Panel   VITAMIN D 25 Hydroxy (Vit-D Deficiency, Fractures)   Encounter for annual physical exam - Primary    CPE completed today.   Labs ordered. Will make changes as necessary based on results.  Review of HM activities and recommendations discussed and provided on AVS Anticipatory guidance, diet, and exercise recommendations provided.  Medications, allergies, and hx reviewed and updated as necessary.  Plan to f/u with CPE in 1 year or sooner for acute/chronic health needs as directed.        Relevant Orders   CBC with Differential/Platelet   Hemoglobin A1c   TSH   Lipid panel   Comprehensive metabolic panel   Iron, TIBC and Ferritin Panel   VITAMIN D 25 Hydroxy (Vit-D Deficiency, Fractures)   Hx of iron deficiency anemia    Monitoring today      Relevant Orders   CBC with Differential/Platelet   Hemoglobin A1c   TSH   Lipid panel   Comprehensive metabolic panel   Iron, TIBC and Ferritin Panel   Uncomplicated varicose veins    Patient inquires about treatment options for varicose veins, though no active symptoms or severe concerns were noted during the consultation.  Plan: - Discussed potential future interventions including less invasive procedures like laser treatments or stripping. -  Advised considering compression socks to prevent worsening of the condition and to follow up if symptoms develop or worsen.      Relevant Orders   CBC with Differential/Platelet   Hemoglobin A1c   TSH   Lipid panel   Comprehensive metabolic panel   Piriformis syndrome of right side    Patient reports chronic pain in the leg, exacerbated by certain activities and positions, which has been diagnosed as piriformis syndrome involving the sciatic nerve. Symptoms include muscle pain and possible nerve irritation, with intermittent exacerbations. Plan: - Recommended management includes stretching, walking, and possibly using ice and heat as needed. - If symptoms persist or worsen, the use of anti-inflammatory medications like ibuprofen and muscle relaxers may be considered. - Patient was advised to start exercises and stretches immediately to prevent worsening of symptoms. - Yoga was also suggested as beneficial for this condition.      Mood changes    Patient expresses feelings of depression potentially linked to significant life changes including turning 61, the death of his mother, and transitioning to a less stimulating job. He also expresses uncertainty about his future and retirement, which seems to contribute to his depressive symptoms. Plan: - Watchful waiting recommended as the patient declined interest in counseling or medication at this time.  - He indicated having sufficient information on managing his feelings.        Follow up plan: NEXT PREVENTATIVE PHYSICAL DUE IN 1 YEAR. No follow-ups on file.  LABORATORY TESTING:  Health maintenance labs ordered today, if applicable.    PATIENT COUNSELING:   For all adult patients, I recommend A  well balanced diet low in saturated fats, cholesterol, and moderation in carbohydrates.  This can be as simple as monitoring portion sizes and cutting back on sugary beverages such as soda and juice to start with.    Daily water consumption  of at least 64 ounces.  Physical activity at least 180 minutes per week, if just starting out.  This can be as simple as taking the stairs instead of the elevator and walking 2-3 laps around the office  purposefully every day.   STD protection, partner selection, and regular testing if high risk.  Limited consumption of alcoholic beverages if alcohol is consumed. For men, I recommend no more than 14 alcoholic beverages per week, spread out throughout the week (max 2 per day). Avoid "binge" drinking or consuming large quantities of alcohol in one setting.  Please let me know if you feel you may need help with reduction or quitting alcohol consumption.   Avoidance of nicotine, if used. Please let me know if you feel you may need help with reduction or quitting nicotine use.   Daily mental health attention. This can be in the form of 5 minute daily meditation, prayer, journaling, yoga, reflection, etc.  Purposeful attention to your emotions and mental state can significantly improve your overall wellbeing  and  Health.  Please know that I am here to help you with all of your health care goals and am happy to work with you to find a solution that works best for you.  The greatest advice I have received with any changes in life are to take it one step at a time, that even means if all you can focus on is the next 60 seconds, then do that and celebrate your victories.  With any changes in life, you will have set backs, and that is OK. The important thing to remember is, if you have a set back, it is not a failure, it is an opportunity to try again!  Health Maintenance Recommendations Screening Testing Mammogram Every 1 -2 years based on history and risk factors Starting at age 30 Pap Smear Ages 21-39 every 3 years Ages 86-65 every 5 years with HPV testing More frequent testing may be required based on results and history Colon Cancer Screening Every 1-10 years based on test performed, risk  factors, and history Starting at age 18 Bone Density Screening Every 2-10 years based on history Starting at age 37 for women Recommendations for men differ based on medication usage, history, and risk factors AAA Screening One time ultrasound Men 8-54 years old who have every smoked Lung Cancer Screening Low Dose Lung CT every 12 months Age 68-80 years with a 30 pack-year smoking history who still smoke or who have quit within the last 15 years   Screening Labs Routine  Labs: Complete Blood Count (CBC), Complete Metabolic Panel (CMP), Cholesterol (Lipid Panel) Every 6-12 months based on history and medications May be recommended more frequently based on current conditions or previous results Hemoglobin A1c Lab Every 3-12 months based on history and previous results Starting at age 48 or earlier with diagnosis of diabetes, high cholesterol, BMI >26, and/or risk factors Frequent monitoring for patients with diabetes to ensure blood sugar control Thyroid Panel (TSH) Every 6 months based on history, symptoms, and risk factors May be repeated more often if on medication HIV One time testing for all patients 3 and older May be repeated more frequently for patients with increased risk factors or exposure Hepatitis C One  time testing for all patients 18 and older May be repeated more frequently for patients with increased risk factors or exposure Gonorrhea, Chlamydia Every 12 months for all sexually active persons 13-24 years Additional monitoring may be recommended for those who are considered high risk or who have symptoms Every 12 months for any woman on birth control, regardless of sexual activity PSA Men 66-53 years old with risk factors Additional screening may be recommended from age 55-69 based on risk factors, symptoms, and history  Vaccine Recommendations Tetanus Booster All adults every 10 years Flu Vaccine All patients 6 months and older every year COVID  Vaccine All patients 12 years and older Initial dosing with booster May recommend additional booster based on age and health history HPV Vaccine 2 doses all patients age 23-26 Dosing may be considered for patients over 26 Shingles Vaccine (Shingrix) 2 doses all adults 55 years and older Pneumonia (Pneumovax 25) All adults 65 years and older May recommend earlier dosing based on health history One year apart from Prevnar 22 Pneumonia (Prevnar 47) All adults 65 years and older Dosed 1 year after Pneumovax 23 Pneumonia (Prevnar 20) One time alternative to the two dosing of 13 and 23 For all adults with initial dose of 23, 20 is recommended 1 year later For all adults with initial dose of 13, 23 is still recommended as second option 1 year later  Additional Screening, Testing, and Vaccinations may be recommended on an individualized basis based on family history, health history, risk factors, and/or exposure.

## 2022-12-23 NOTE — Assessment & Plan Note (Signed)
Patient is a 12-year survivor of esophageal cancer, currently experiencing symptoms consistent with dumping syndrome, particularly triggered by sugar intake. He manages these episodes with dietary adjustments, including the intake of protein to stabilize blood sugar levels. Plan: - Continue dietary management as currently practiced.  - Patient uses peanut butter and crackers to mitigate symptoms effectively. - Advised to monitor symptoms and adjust diet as necessary to manage dumping syndrome.

## 2022-12-23 NOTE — Assessment & Plan Note (Addendum)
Patient expresses feelings of depression potentially linked to significant life changes including turning 61, the death of his mother, and transitioning to a less stimulating job. He also expresses uncertainty about his future and retirement, which seems to contribute to his depressive symptoms. Plan: - Watchful waiting recommended as the patient declined interest in counseling or medication at this time.  - He indicated having sufficient information on managing his feelings.

## 2022-12-23 NOTE — Assessment & Plan Note (Signed)
Patient reports chronic pain in the leg, exacerbated by certain activities and positions, which has been diagnosed as piriformis syndrome involving the sciatic nerve. Symptoms include muscle pain and possible nerve irritation, with intermittent exacerbations. Plan: - Recommended management includes stretching, walking, and possibly using ice and heat as needed. - If symptoms persist or worsen, the use of anti-inflammatory medications like ibuprofen and muscle relaxers may be considered. - Patient was advised to start exercises and stretches immediately to prevent worsening of symptoms. - Yoga was also suggested as beneficial for this condition.

## 2022-12-23 NOTE — Assessment & Plan Note (Signed)
Patient inquires about treatment options for varicose veins, though no active symptoms or severe concerns were noted during the consultation.  Plan: - Discussed potential future interventions including less invasive procedures like laser treatments or stripping. - Advised considering compression socks to prevent worsening of the condition and to follow up if symptoms develop or worsen.

## 2022-12-24 LAB — LIPID PANEL
Chol/HDL Ratio: 1.9 ratio (ref 0.0–5.0)
Cholesterol, Total: 204 mg/dL — ABNORMAL HIGH (ref 100–199)
HDL: 105 mg/dL (ref >39)
LDL Chol Calc (NIH): 89 mg/dL (ref 0–99)
Triglycerides: 51 mg/dL (ref 0–149)
VLDL Cholesterol Cal: 10 mg/dL (ref 5–40)

## 2022-12-24 LAB — COMPREHENSIVE METABOLIC PANEL
ALT: 17 IU/L (ref 0–44)
AST: 27 IU/L (ref 0–40)
Albumin: 4.8 g/dL (ref 3.8–4.9)
Alkaline Phosphatase: 74 IU/L (ref 44–121)
BUN/Creatinine Ratio: 10 (ref 10–24)
BUN: 8 mg/dL (ref 8–27)
Bilirubin Total: 0.5 mg/dL (ref 0.0–1.2)
CO2: 23 mmol/L (ref 20–29)
Calcium: 9.6 mg/dL (ref 8.6–10.2)
Chloride: 96 mmol/L (ref 96–106)
Creatinine, Ser: 0.77 mg/dL (ref 0.76–1.27)
Globulin, Total: 2.3 g/dL (ref 1.5–4.5)
Glucose: 91 mg/dL (ref 70–99)
Potassium: 4.3 mmol/L (ref 3.5–5.2)
Sodium: 132 mmol/L — ABNORMAL LOW (ref 134–144)
Total Protein: 7.1 g/dL (ref 6.0–8.5)
eGFR: 102 mL/min/{1.73_m2} (ref >59)

## 2022-12-24 LAB — HEMOGLOBIN A1C
Est. average glucose Bld gHb Est-mCnc: 131 mg/dL
Hgb A1c MFr Bld: 6.2 % — ABNORMAL HIGH (ref 4.8–5.6)

## 2022-12-24 LAB — TSH: TSH: 0.832 u[IU]/mL (ref 0.450–4.500)

## 2022-12-24 LAB — CBC WITH DIFFERENTIAL/PLATELET
Basophils Absolute: 0.1 10*3/uL (ref 0.0–0.2)
Basos: 1 %
EOS (ABSOLUTE): 0.1 10*3/uL (ref 0.0–0.4)
Eos: 2 %
Hematocrit: 39.8 % (ref 37.5–51.0)
Hemoglobin: 13.1 g/dL (ref 13.0–17.7)
Immature Grans (Abs): 0 10*3/uL (ref 0.0–0.1)
Immature Granulocytes: 1 %
Lymphocytes Absolute: 1.2 10*3/uL (ref 0.7–3.1)
Lymphs: 28 %
MCH: 29.8 pg (ref 26.6–33.0)
MCHC: 32.9 g/dL (ref 31.5–35.7)
MCV: 91 fL (ref 79–97)
Monocytes Absolute: 0.4 10*3/uL (ref 0.1–0.9)
Monocytes: 9 %
Neutrophils Absolute: 2.5 10*3/uL (ref 1.4–7.0)
Neutrophils: 59 %
Platelets: 297 10*3/uL (ref 150–450)
RBC: 4.39 x10E6/uL (ref 4.14–5.80)
RDW: 13.4 % (ref 11.6–15.4)
WBC: 4.3 10*3/uL (ref 3.4–10.8)

## 2022-12-24 LAB — IRON,TIBC AND FERRITIN PANEL
Ferritin: 57 ng/mL (ref 30–400)
Iron Saturation: 24 % (ref 15–55)
Iron: 91 ug/dL (ref 38–169)
Total Iron Binding Capacity: 376 ug/dL (ref 250–450)
UIBC: 285 ug/dL (ref 111–343)

## 2022-12-24 LAB — VITAMIN D 25 HYDROXY (VIT D DEFICIENCY, FRACTURES): Vit D, 25-Hydroxy: 40.1 ng/mL (ref 30.0–100.0)

## 2023-01-03 ENCOUNTER — Encounter: Payer: Self-pay | Admitting: Nurse Practitioner

## 2023-02-03 ENCOUNTER — Encounter: Payer: Self-pay | Admitting: Nurse Practitioner

## 2023-02-10 ENCOUNTER — Encounter: Payer: Self-pay | Admitting: Nurse Practitioner

## 2023-02-10 ENCOUNTER — Ambulatory Visit (INDEPENDENT_AMBULATORY_CARE_PROVIDER_SITE_OTHER): Payer: 59 | Admitting: Nurse Practitioner

## 2023-02-10 VITALS — BP 110/68 | HR 72 | Wt 197.2 lb

## 2023-02-10 DIAGNOSIS — G8929 Other chronic pain: Secondary | ICD-10-CM | POA: Diagnosis not present

## 2023-02-10 DIAGNOSIS — M25551 Pain in right hip: Secondary | ICD-10-CM

## 2023-02-10 NOTE — Progress Notes (Signed)
  Shawna Clamp, DNP, AGNP-c [facility]  [address] [place], [place] [address] [phone]  ESTABLISHED PATIENT- Chronic Health and/or Follow-Up Visit  Blood pressure 110/68, pulse 72, weight 197 lb 3.2 oz (89.4 kg).    Alfred Delgado is a 62 y.o. year old male presenting today for evaluation and management of right leg pain.    Alfred Delgado presents with a chief complaint of persistent right leg pain, which involves the groin and thigh area as well. He states that he can perform the previously prescribed exercises, but experiences discomfort in the right leg, particularly when performing extensors. He reports difficulty in lifting his right leg and sometimes needing assistance, along with a "clunk" sound when releasing the leg. He also mentions pain when getting in and out of cars and while carrying heavy objects.    He describes the pain as originating from the hip and radiating down to the knee, with a sensation of weakness in the abductors. He has been performing exercises at home, but finds it challenging to lift his right leg towards the front while lying down. He attempted running but was limited to two miles due to discomfort. He suspects that weak muscles in the back of the thigh might be causing the front thigh muscles to compensate, leading to pain in the groin and medial to the knee.  Additionally, he reports ongoing foot pain since December or November, which worsens with walking and does not resolve due to the aggravation from daily activities. The patient is interested in pursuing physical therapy and imaging to further evaluate and address his leg and hip pain.  All ROS negative with exception of what is listed above.   PHYSICAL EXAM Physical Exam Musculoskeletal:        General: No swelling.     Right lower leg: No edema.     Left lower leg: No edema.  Skin:    General: Skin is warm and dry.     Capillary Refill: Capillary refill takes less than 2 seconds.   Neurological:     Mental Status: He is oriented to person, place, and time.     Sensory: No sensory deficit.     Motor: No weakness.     Coordination: Coordination normal.     Gait: Gait abnormal.     Deep Tendon Reflexes: Reflexes normal.  Psychiatric:        Behavior: Behavior normal.     PLAN Problem List Items Addressed This Visit     Chronic right hip pain - Primary    Right hip pain, initially thought to be piriformis syndrome with no improvement with conservative measures. The pain radiates from the hip in the groin, thigh, and knee on the right side. No weakness or inflammation apparent on exam. Will send to PT for further evaluation.      Relevant Orders   Ambulatory referral to Physical Therapy   DG Hip Unilat W OR W/O Pelvis 2-3 Views Right (Completed)    Return if symptoms worsen or fail to improve.  Time: 18 minutes, >50% spent counseling, care coordination, chart review, and documentation.   Shawna Clamp, DNP, AGNP-c

## 2023-02-10 NOTE — Patient Instructions (Signed)
I have sent in the order for the x-ray at [address] at [facility]. You can walk in to have this done.   I sent the order for PT. They will call you to schedule.

## 2023-02-11 ENCOUNTER — Ambulatory Visit
Admission: RE | Admit: 2023-02-11 | Discharge: 2023-02-11 | Disposition: A | Discharge status: Home / Self Care | Payer: 59 | Source: Ambulatory Visit | Attending: Nurse Practitioner | Admitting: Nurse Practitioner

## 2023-02-11 DIAGNOSIS — G8929 Other chronic pain: Secondary | ICD-10-CM

## 2023-02-17 ENCOUNTER — Encounter: Payer: Self-pay | Admitting: Nurse Practitioner

## 2023-02-20 DIAGNOSIS — G8929 Other chronic pain: Secondary | ICD-10-CM | POA: Insufficient documentation

## 2023-02-20 NOTE — Assessment & Plan Note (Signed)
Right hip pain, initially thought to be piriformis syndrome with no improvement with conservative measures. The pain radiates from the hip in the groin, thigh, and knee on the right side. No weakness or inflammation apparent on exam. Will send to PT for further evaluation.

## 2023-02-21 NOTE — Therapy (Unsigned)
OUTPATIENT PHYSICAL THERAPY LOWER EXTREMITY EVALUATION   Patient Name: Alfred Delgado MRN: [id number] DOB:[birth year], 62 y.o., male Today's Date: 02/24/2023  END OF SESSION:  PT End of Session - 02/24/23 1531     Visit Number 1    Number of Visits 6    Date for PT Re-Evaluation 04/26/23    Authorization Type UHC    PT Start Time 1445    PT Stop Time 1530    PT Time Calculation (min) 45 min    Activity Tolerance Patient tolerated treatment well    Behavior During Therapy [facility] for tasks assessed/performed             Past Medical History:  Diagnosis Date   Allergy    Anemia    Asthma    BELCHING 07/23/2010   Qualifier: Diagnosis of  By: Felicity Coyer MD, Raenette Rover    CHEST PAIN 06/25/2010   Qualifier: Diagnosis of  By: Felicity Coyer MD, Vikki Ports A    DVT of lower extremity (deep venous thrombosis) (HCC)    History of colon polyps    History of colonic diverticulitis    LUQ PAIN 06/25/2010   Qualifier: Diagnosis of  By: Felicity Coyer MD, Vikki Ports A    Multiple lipomas    History of   NEOPLASM, MALIGNANT, ESOPHAGUS 08/30/2010   Past Surgical History:  Procedure Laterality Date   COLONOSCOPY  last 2013   ESOPHAGOGASTRECTOMY  2012   Dr. Arbie Cookey   POLYPECTOMY     PORTA CATH REMOVAL     PORTACATH PLACEMENT  03/14/2011   TONSILLECTOMY     UPPER GASTROINTESTINAL ENDOSCOPY     Patient Active Problem List   Diagnosis Date Noted   Chronic right hip pain 02/20/2023   Uncomplicated varicose veins 12/23/2022   Piriformis syndrome of right side 12/23/2022   Mood changes 12/23/2022   Hx of iron deficiency anemia 05/23/2021   Positive depression screening 05/23/2021   H/O therapeutic radiation 02/24/2018   History of chemotherapy 02/24/2018   Encounter for annual physical exam 02/16/2015   History of esophageal cancer 08/30/2010   GERD 07/23/2010   UNSPECIFIED HYPOTENSION 06/25/2010   ALLERGIC RHINITIS 06/25/2010   ASTHMA 06/25/2010   COLONIC POLYPS, HX OF 06/25/2010    DIVERTICULITIS, HX OF 06/25/2010    PCP: Tollie Eth, NP  REFERRING PROVIDER: Tollie Eth, NP  REFERRING DIAG: [phone] (ICD-10-CM) - Chronic right hip pain  THERAPY DIAG:  Pain in right hip  Other abnormalities of gait and mobility  Muscle weakness (generalized)  Rationale for Evaluation and Treatment: Rehabilitation  ONSET DATE: chronic  SUBJECTIVE:   SUBJECTIVE STATEMENT: 1 year history of R hip pain, beginning anteriorly and extending to thigh.  Worse with prolonged walking and sitting.  Improved with Nsaids and stretching.  Denies symptoms with stair negotiation.  PERTINENT HISTORY: Alfred Delgado is a 62 y.o. year old male presenting today for evaluation and management of chronic conditions.    Right leg and lumbar spine with pain in the groin  and raidaiton into the buttocjks, hip, and down into the medial portion of the knee   All ROS negative with exception of what is listed above.  PAIN:  Are you having pain? Yes: NPRS scale: 6/10 Pain location: R hip anteriorly Pain description: ache Aggravating factors: prolonged standing Relieving factors: aleve/  PRECAUTIONS: None  RED FLAGS: None   WEIGHT BEARING RESTRICTIONS: No  FALLS:  Has patient fallen in last 6 months? No  OCCUPATION: airplane tech  PLOF: Independent  PATIENT GOALS: To reduce and manage my hip pain  NEXT MD VISIT: TBD  OBJECTIVE:   DIAGNOSTIC FINDINGS: CLINICAL DATA:  Right hip and inguinal pain radiating to right knee for 6 months   EXAM: DG HIP (WITH OR WITHOUT PELVIS) 2-3V RIGHT   COMPARISON:  None Available.   FINDINGS: Frontal view of the pelvis as well as frontal and frogleg lateral views of the right hip are obtained. No acute fracture, subluxation, or dislocation. There is bilateral hip osteoarthritis, moderate on the right and mild on the left. Circumscribed lucency with sclerotic margins within the left femoral neck consistent with synovial herniation, or  "Pitt's pit". The remainder of the bony pelvis is unremarkable. Sacroiliac joints are normal.   IMPRESSION: 1. Bilateral hip osteoarthritis, right greater than left. 2. No acute displaced fracture.     Electronically Signed   By: Sharlet Salina M.D.   On: 02/17/2023 19:00  PATIENT SURVEYS:  FOTO TBD  MUSCLE LENGTH: Hamstrings: Right 80 deg; Left 80 deg Thomas test: negative  POSTURE: No Significant postural limitations  PALPATION: TTP R piriformis, hip flexor tendon and TFL  LOWER EXTREMITY ROM: WFL throughout  Active ROM Right eval Left eval  Hip flexion    Hip extension    Hip abduction    Hip adduction    Hip internal rotation    Hip external rotation    Knee flexion    Knee extension    Ankle dorsiflexion    Ankle plantarflexion    Ankle inversion    Ankle eversion     (Blank rows = not tested)  LOWER EXTREMITY MMT:  MMT Right eval Left eval  Hip flexion 4+ 5  Hip extension 4+ 5  Hip abduction 4+ 5  Hip adduction    Hip internal rotation    Hip external rotation    Knee flexion    Knee extension    Ankle dorsiflexion    Ankle plantarflexion    Ankle inversion    Ankle eversion     (Blank rows = not tested)  LOWER EXTREMITY SPECIAL TESTS:  Hip special tests: Luisa Hart (FABER) test: negative, Thomas test: negative, Ober's test: negative, Anterior hip impingement test: positive , and Piriformis test: negative  FUNCTIONAL TESTS:  5 times sit to stand: 10s arms crossed  GAIT: Distance walked: 71ft x2 Assistive device utilized: None Level of assistance: Complete Independence Comments: antalgic   TODAY'S TREATMENT:                                                                                                                              DATE: 02/24/23 Eval and HEP    PATIENT EDUCATION:  Education details: Discussed eval findings, rehab rationale and POC and patient is in agreement  Person educated: Patient Education method:  Explanation Education comprehension: verbalized understanding and needs further education  HOME EXERCISE PROGRAM: Access Code: [email or web address] URL: [email or web address] Date: 02/24/2023 Prepared by: Gustavus Bryant  Exercises -  Hip Flexor Stretch at Edge of Bed  - 2 x daily - 5 x weekly - 1 sets - 2 reps - 30s hold - Supine Piriformis Stretch with Foot on Ground  - 2 x daily - 5 x weekly - 1 sets - 2 reps - 30s hold - Clamshell  - 2 x daily - 5 x weekly - 2 sets - 15 reps  ASSESSMENT:  CLINICAL IMPRESSION: Patient is a 62 y.o. male who was seen today for physical therapy evaluation and treatment for R hip pain. Patient demonstrates only mild strength deficits in R hip.  Hamstring, ITB, hip flexor stretching unremarkable.  Palpation finds active trigger points in rectus femoris and TFL on R.  5x STS time WFL, mild discomfort with FAI stressing.  OBJECTIVE IMPAIRMENTS: Abnormal gait.   ACTIVITY LIMITATIONS: sitting, standing, and prolonged walking  PERSONAL FACTORS: Age, Fitness, and Time since onset of injury/illness/exacerbation are also affecting patient's functional outcome.   REHAB POTENTIAL: Good  CLINICAL DECISION MAKING: Stable/uncomplicated  EVALUATION COMPLEXITY: Low   GOALS: Goals reviewed with patient? No  SHORT TERM GOALS: Target date: 03/17/2023   Patient to demonstrate independence in HEP  Baseline: [id number] Goal status: INITIAL   LONG TERM GOALS: Target date: 04/07/2023    Assess FOTO Baseline: TBD Goal status: INITIAL  2.  Increase R hip strength to 5/5 Baseline:  MMT Right eval Left eval  Hip flexion 4+ 5  Hip extension 4+ 5  Hip abduction 4+ 5   Goal status: INITIAL  3.  Resolve active trigger points in R hip region Baseline: TTP R TFL and RF Goal status: INITIAL  4.  Decrease worst pain to 2/10 Baseline: 6/10 Goal status: INITIAL   PLAN:  PT FREQUENCY: 1-2x/week  PT DURATION: 6 weeks  PLANNED INTERVENTIONS: Therapeutic  exercises, Therapeutic activity, Neuromuscular re-education, Balance training, Gait training, Patient/Family education, Self Care, Joint mobilization, Stair training, Dry Needling, Electrical stimulation, Spinal mobilization, Cryotherapy, Moist heat, Manual therapy, and Re-evaluation  PLAN FOR NEXT SESSION: HEP review and update, manual techniques as appropriate, aerobic tasks, ROM and flexibility activities, strengthening and PREs, TPDN, gait and balance training as needed     Hildred Laser, PT 02/24/2023, 3:45 PM

## 2023-02-24 ENCOUNTER — Ambulatory Visit: Payer: 59 | Attending: Nurse Practitioner

## 2023-02-24 DIAGNOSIS — M6281 Muscle weakness (generalized): Secondary | ICD-10-CM | POA: Diagnosis present

## 2023-02-24 DIAGNOSIS — M25551 Pain in right hip: Secondary | ICD-10-CM | POA: Insufficient documentation

## 2023-02-24 DIAGNOSIS — G8929 Other chronic pain: Secondary | ICD-10-CM | POA: Diagnosis not present

## 2023-02-24 DIAGNOSIS — R2689 Other abnormalities of gait and mobility: Secondary | ICD-10-CM | POA: Diagnosis present

## 2023-03-03 ENCOUNTER — Ambulatory Visit: Payer: 59

## 2023-03-03 DIAGNOSIS — R2689 Other abnormalities of gait and mobility: Secondary | ICD-10-CM

## 2023-03-03 DIAGNOSIS — M25551 Pain in right hip: Secondary | ICD-10-CM | POA: Diagnosis not present

## 2023-03-03 DIAGNOSIS — M6281 Muscle weakness (generalized): Secondary | ICD-10-CM

## 2023-03-03 NOTE — Therapy (Signed)
OUTPATIENT PHYSICAL THERAPY LOWER EXTREMITY EVALUATION   Patient Name: Alfred Delgado MRN: [id number] DOB:[birth year], 62 y.o., male Today's Date: 03/03/2023  END OF SESSION:  PT End of Session - 03/03/23 0743     Visit Number 2    Number of Visits 6    Date for PT Re-Evaluation 04/26/23    Authorization Type UHC    PT Start Time 0745    PT Stop Time 0825    PT Time Calculation (min) 40 min    Activity Tolerance Patient tolerated treatment well    Behavior During Therapy Alfred Delgado for tasks assessed/performed              Past Medical History:  Diagnosis Date   Allergy    Anemia    Asthma    BELCHING 07/23/2010   Qualifier: Diagnosis of  By: Alfred Coyer MD, Alfred Delgado    CHEST PAIN 06/25/2010   Qualifier: Diagnosis of  By: Alfred Coyer MD, Alfred Delgado    DVT of lower extremity (deep venous thrombosis) (HCC)    History of colon polyps    History of colonic diverticulitis    LUQ PAIN 06/25/2010   Qualifier: Diagnosis of  By: Alfred Coyer MD, Alfred Delgado    Multiple lipomas    History of   NEOPLASM, MALIGNANT, ESOPHAGUS 08/30/2010   Past Surgical History:  Procedure Laterality Date   COLONOSCOPY  last 2013   ESOPHAGOGASTRECTOMY  2012   Dr. Arbie Delgado   POLYPECTOMY     PORTA CATH REMOVAL     PORTACATH PLACEMENT  03/14/2011   TONSILLECTOMY     UPPER GASTROINTESTINAL ENDOSCOPY     Patient Active Problem List   Diagnosis Date Noted   Chronic right hip pain 02/20/2023   Uncomplicated varicose veins 12/23/2022   Piriformis syndrome of right side 12/23/2022   Mood changes 12/23/2022   Hx of iron deficiency anemia 05/23/2021   Positive depression screening 05/23/2021   H/O therapeutic radiation 02/24/2018   History of chemotherapy 02/24/2018   Encounter for annual physical exam 02/16/2015   History of esophageal cancer 08/30/2010   GERD 07/23/2010   UNSPECIFIED HYPOTENSION 06/25/2010   ALLERGIC RHINITIS 06/25/2010   ASTHMA 06/25/2010   COLONIC POLYPS, HX OF 06/25/2010    DIVERTICULITIS, HX OF 06/25/2010    PCP: Alfred Eth, NP  REFERRING PROVIDER: Tollie Eth, NP  REFERRING DIAG: [phone] (ICD-10-CM) - Chronic right hip pain  THERAPY DIAG:  Pain in right hip  Other abnormalities of gait and mobility  Muscle weakness (generalized)  Rationale for Evaluation and Treatment: Rehabilitation  ONSET DATE: chronic  SUBJECTIVE:   SUBJECTIVE STATEMENT: Increased stiffness in R hip Delgado e had to maintain Delgado crouched position at work for several hours.  Has been compliant with HEP  PERTINENT HISTORY: Alfred Delgado is Delgado 62 y.o. year old male presenting today for evaluation and management of chronic conditions.    Right leg and lumbar spine with pain in the groin  and raidaiton into the buttocjks, hip, and down into the medial portion of the knee   All ROS negative with exception of what is listed above.  PAIN:  Are you having pain? Yes: NPRS scale: 6/10 Pain location: R hip anteriorly Pain description: ache Aggravating factors: prolonged standing Relieving factors: aleve/  PRECAUTIONS: None  RED FLAGS: None   WEIGHT BEARING RESTRICTIONS: No  FALLS:  Has patient fallen in last 6 months? No  OCCUPATION: airplane tech  PLOF: Independent  PATIENT GOALS: To reduce and manage  my hip pain  NEXT MD VISIT: TBD  OBJECTIVE:   DIAGNOSTIC FINDINGS: CLINICAL DATA:  Right hip and inguinal pain radiating to right knee for 6 months   EXAM: DG HIP (WITH OR WITHOUT PELVIS) 2-3V RIGHT   COMPARISON:  None Available.   FINDINGS: Frontal view of the pelvis as well as frontal and frogleg lateral views of the right hip are obtained. No acute fracture, subluxation, or dislocation. There is bilateral hip osteoarthritis, moderate on the right and mild on the left. Circumscribed lucency with sclerotic margins within the left femoral neck consistent with synovial herniation, or "Pitt's pit". The remainder of the bony pelvis is unremarkable.  Sacroiliac joints are normal.   IMPRESSION: 1. Bilateral hip osteoarthritis, right greater than left. 2. No acute displaced fracture.     Electronically Signed   By: Alfred Delgado M.D.   On: 02/17/2023 19:00  PATIENT SURVEYS:  FOTO TBD  MUSCLE LENGTH: Hamstrings: Right 80 deg; Left 80 deg Thomas test: negative  POSTURE: No Significant postural limitations  PALPATION: TTP R piriformis, hip flexor tendon and TFL  LOWER EXTREMITY ROM: WFL throughout  Active ROM Right eval Left eval  Hip flexion    Hip extension    Hip abduction    Hip adduction    Hip internal rotation    Hip external rotation    Knee flexion    Knee extension    Ankle dorsiflexion    Ankle plantarflexion    Ankle inversion    Ankle eversion     (Blank rows = not tested)  LOWER EXTREMITY MMT:  MMT Right eval Left eval  Hip flexion 4+ 5  Hip extension 4+ 5  Hip abduction 4+ 5  Hip adduction    Hip internal rotation    Hip external rotation    Knee flexion    Knee extension    Ankle dorsiflexion    Ankle plantarflexion    Ankle inversion    Ankle eversion     (Blank rows = not tested)  LOWER EXTREMITY SPECIAL TESTS:  Hip special tests: Alfred Delgado (FABER) test: negative, Thomas test: negative, Ober's test: negative, Anterior hip impingement test: positive , and Piriformis test: negative  FUNCTIONAL TESTS:  5 times sit to stand: 10s arms crossed  GAIT: Distance walked: 69ft x2 Assistive device utilized: None Level of assistance: Complete Independence Comments: antalgic   TODAY'S TREATMENT:         [facility] Adult PT Treatment:                                                DATE: 03/03/23 Therapeutic Exercise: Nustep L4 6 min Hip flexor stretch R 30s x1 R piriformis stretch cross body 30s x1 R clams BluTB 15x Bridge with BluTB 15x Manual Therapy: Skilled palpation to identify taught bands in R TFL and rectus femoris Trigger Point Dry Needling Treatment: Pre-treatment instruction:  Patient instructed on dry needling rationale, procedures, and possible side effects including pain during treatment (achy,cramping feeling), bruising, drop of blood, lightheadedness, nausea, sweating. Patient Consent Given: Yes Education handout provided: Previously provided Muscles treated: R TFL  Needle size and number: .30x26mm x 2 Electrical stimulation performed: No Parameters: N/Delgado Treatment response/outcome: Twitch response elicited and Palpable decrease in muscle tension Post-treatment instructions: Patient instructed to expect possible mild to moderate muscle soreness later today and/or tomorrow. Patient instructed  in methods to reduce muscle soreness and to continue prescribed HEP. If patient was dry needled over the lung field, patient was instructed on signs and symptoms of pneumothorax and, however unlikely, to see immediate medical attention should they occur. Patient was also educated on signs and symptoms of infection and to seek medical attention should they occur. Patient verbalized understanding of these instructions and education.                                                                                                                         DATE: 02/24/23 Eval and HEP    PATIENT EDUCATION:  Education details: Discussed eval findings, rehab rationale and POC and patient is in agreement  Person educated: Patient Education method: Explanation Education comprehension: verbalized understanding and needs further education  HOME EXERCISE PROGRAM: Access Code: [email or web address] URL: [email or web address] Date: 02/24/2023 Prepared by: Gustavus Bryant  Exercises - Hip Flexor Stretch at [facility] of Bed  - 2 x daily - 5 x weekly - 1 sets - 2 reps - 30s hold - Supine Piriformis Stretch with Foot on Ground  - 2 x daily - 5 x weekly - 1 sets - 2 reps - 30s hold - Clamshell  - 2 x daily - 5 x weekly - 2 sets - 15 reps  ASSESSMENT:  CLINICAL IMPRESSION: Today's session  focused on TPDN described in note, aerobic work for circulation, HEP review and update.  Added additional strengthening tasks for hip musculature and issued BluTB to patient.   Patient is Delgado 62 y.o. male who was seen today for physical therapy evaluation and treatment for R hip pain. Patient demonstrates only mild strength deficits in R hip.  Hamstring, ITB, hip flexor stretching unremarkable.  Palpation finds active trigger points in rectus femoris and TFL on R.  5x STS time WFL, mild discomfort with FAI stressing.  OBJECTIVE IMPAIRMENTS: Abnormal gait.   ACTIVITY LIMITATIONS: sitting, standing, and prolonged walking  PERSONAL FACTORS: Age, Fitness, and Time since onset of injury/illness/exacerbation are also affecting patient's functional outcome.   REHAB POTENTIAL: Good  CLINICAL DECISION MAKING: Stable/uncomplicated  EVALUATION COMPLEXITY: Low   GOALS: Goals reviewed with patient? No  SHORT TERM GOALS: Target date: 03/17/2023   Patient to demonstrate independence in HEP  Baseline: [id number] Goal status: INITIAL   LONG TERM GOALS: Target date: 04/07/2023    Assess FOTO Baseline: TBD Goal status: INITIAL  2.  Increase R hip strength to 5/5 Baseline:  MMT Right eval Left eval  Hip flexion 4+ 5  Hip extension 4+ 5  Hip abduction 4+ 5   Goal status: INITIAL  3.  Resolve active trigger points in R hip region Baseline: TTP R TFL and RF Goal status: INITIAL  4.  Decrease worst pain to 2/10 Baseline: 6/10 Goal status: INITIAL   PLAN:  PT FREQUENCY: 1-2x/week  PT DURATION: 6 weeks  PLANNED INTERVENTIONS: Therapeutic exercises, Therapeutic activity, Neuromuscular re-education, Balance training, Gait training, Patient/Family education, Self Care, Joint mobilization,  Stair training, Dry Needling, Electrical stimulation, Spinal mobilization, Cryotherapy, Moist heat, Manual therapy, and Re-evaluation  PLAN FOR NEXT SESSION: HEP review and update, manual techniques as  appropriate, aerobic tasks, ROM and flexibility activities, strengthening and PREs, TPDN, gait and balance training as needed     Hildred Laser, PT 03/03/2023, 8:30 AM

## 2023-03-17 ENCOUNTER — Ambulatory Visit: Payer: 59 | Attending: Nurse Practitioner

## 2023-03-17 DIAGNOSIS — R2689 Other abnormalities of gait and mobility: Secondary | ICD-10-CM | POA: Diagnosis present

## 2023-03-17 DIAGNOSIS — M6281 Muscle weakness (generalized): Secondary | ICD-10-CM | POA: Insufficient documentation

## 2023-03-17 DIAGNOSIS — M25551 Pain in right hip: Secondary | ICD-10-CM | POA: Diagnosis present

## 2023-03-17 NOTE — Therapy (Signed)
OUTPATIENT PHYSICAL THERAPY TREATMENT   Patient Name: Alfred Delgado MRN: [id number] DOB:1961-07-08, 62 y.o., male Today's Date: 03/17/2023  END OF SESSION:  PT End of Session - 03/17/23 1359     Visit Number 3    Number of Visits 6    Date for PT Re-Evaluation 04/26/23    Authorization Type [employer]    PT Start Time 1400    PT Stop Time 1440    PT Time Calculation (min) 40 min    Activity Tolerance Patient tolerated treatment well    Behavior During Therapy [facility] for tasks assessed/performed               Past Medical History:  Diagnosis Date   Allergy    Anemia    Asthma    BELCHING 07/23/2010   Qualifier: Diagnosis of  By: Felicity Coyer MD, Raenette Rover    CHEST PAIN 06/25/2010   Qualifier: Diagnosis of  By: Felicity Coyer MD, Vikki Ports A    DVT of lower extremity (deep venous thrombosis) (HCC)    History of colon polyps    History of colonic diverticulitis    LUQ PAIN 06/25/2010   Qualifier: Diagnosis of  By: Felicity Coyer MD, Vikki Ports A    Multiple lipomas    History of   NEOPLASM, MALIGNANT, ESOPHAGUS 08/30/2010   Past Surgical History:  Procedure Laterality Date   COLONOSCOPY  last 2013   ESOPHAGOGASTRECTOMY  2012   Dr. Arbie Cookey   POLYPECTOMY     PORTA CATH REMOVAL     PORTACATH PLACEMENT  03/14/2011   TONSILLECTOMY     UPPER GASTROINTESTINAL ENDOSCOPY     Patient Active Problem List   Diagnosis Date Noted   Chronic right hip pain 02/20/2023   Uncomplicated varicose veins 12/23/2022   Piriformis syndrome of right side 12/23/2022   Mood changes 12/23/2022   Hx of iron deficiency anemia 05/23/2021   Positive depression screening 05/23/2021   H/O therapeutic radiation 02/24/2018   History of chemotherapy 02/24/2018   Encounter for annual physical exam 02/16/2015   History of esophageal cancer 08/30/2010   GERD 07/23/2010   UNSPECIFIED HYPOTENSION 06/25/2010   ALLERGIC RHINITIS 06/25/2010   ASTHMA 06/25/2010   COLONIC POLYPS, HX OF 06/25/2010   DIVERTICULITIS, HX OF  06/25/2010    PCP: Tollie Eth, NP  REFERRING PROVIDER: Tollie Eth, NP  REFERRING DIAG: [phone] (ICD-10-CM) - Chronic right hip pain  THERAPY DIAG:  Pain in right hip  Other abnormalities of gait and mobility  Muscle weakness (generalized)  Rationale for Evaluation and Treatment: Rehabilitation  ONSET DATE: chronic  SUBJECTIVE:   SUBJECTIVE STATEMENT: Pt presents to PT with reports of continued R anterior hip pain. Pt notes HEP compliance.  PERTINENT HISTORY: See PMH  PAIN:  Are you having pain?  Yes: NPRS scale: 6/10 Pain location: R hip anteriorly Pain description: ache Aggravating factors: prolonged standing Relieving factors: aleve/  PRECAUTIONS: None  RED FLAGS: None   WEIGHT BEARING RESTRICTIONS: No  FALLS:  Has patient fallen in last 6 months? No  OCCUPATION: airplane tech  PLOF: Independent  PATIENT GOALS: To reduce and manage my hip pain  NEXT MD VISIT: TBD  OBJECTIVE:   DIAGNOSTIC FINDINGS: CLINICAL DATA:  Right hip and inguinal pain radiating to right knee for 6 months   EXAM: DG HIP (WITH OR WITHOUT PELVIS) 2-3V RIGHT   COMPARISON:  None Available.   FINDINGS: Frontal view of the pelvis as well as frontal and frogleg lateral views of the right  hip are obtained. No acute fracture, subluxation, or dislocation. There is bilateral hip osteoarthritis, moderate on the right and mild on the left. Circumscribed lucency with sclerotic margins within the left femoral neck consistent with synovial herniation, or "Pitt's pit". The remainder of the bony pelvis is unremarkable. Sacroiliac joints are normal.   IMPRESSION: 1. Bilateral hip osteoarthritis, right greater than left. 2. No acute displaced fracture.     Electronically Signed   By: Sharlet Salina M.D.   On: 02/17/2023 19:00  PATIENT SURVEYS:  FOTO TBD  MUSCLE LENGTH: Hamstrings: Right 80 deg; Left 80 deg Thomas test: negative  POSTURE: No Significant postural  limitations  PALPATION: TTP R piriformis, hip flexor tendon and TFL  LOWER EXTREMITY ROM: WFL throughout  Active ROM Right eval Left eval  Hip flexion    Hip extension    Hip abduction    Hip adduction    Hip internal rotation    Hip external rotation    Knee flexion    Knee extension    Ankle dorsiflexion    Ankle plantarflexion    Ankle inversion    Ankle eversion     (Blank rows = not tested)  LOWER EXTREMITY MMT:  MMT Right eval Left eval  Hip flexion 4+ 5  Hip extension 4+ 5  Hip abduction 4+ 5  Hip adduction    Hip internal rotation    Hip external rotation    Knee flexion    Knee extension    Ankle dorsiflexion    Ankle plantarflexion    Ankle inversion    Ankle eversion     (Blank rows = not tested)  LOWER EXTREMITY SPECIAL TESTS:  Hip special tests: Luisa Hart (FABER) test: negative, Thomas test: negative, Ober's test: negative, Anterior hip impingement test: positive , and Piriformis test: negative  FUNCTIONAL TESTS:  5 times sit to stand: 10s arms crossed  GAIT: Distance walked: 79ft x2 Assistive device utilized: None Level of assistance: Complete Independence Comments: antalgic   TODAY'S TREATMENT:         [facility] Adult PT Treatment:                                                DATE: 03/17/23 Therapeutic Exercise: Nustep L5 x 3 min while taking subjective S/L clamshell 2x10 GTB Bridge 2x10 GTB S/L hip abd 2x10 each Supine SLR - unable on R LE Manual Therapy: R hip mobilization with movement into IR/ER using manipulation belt Grade II R hip LAD grade III  [facility] Adult PT Treatment:                                                DATE: 03/03/23 Therapeutic Exercise: Nustep L4 6 min Hip flexor stretch R 30s x1 R piriformis stretch cross body 30s x1 R clams BluTB 15x Bridge with BluTB 15x Manual Therapy: Skilled palpation to identify taught bands in R TFL and rectus femoris Trigger Point Dry Needling Treatment: Pre-treatment instruction:  Patient instructed on dry needling rationale, procedures, and possible side effects including pain during treatment (achy,cramping feeling), bruising, drop of blood, lightheadedness, nausea, sweating. Patient Consent Given: Yes Education handout provided: Previously provided Muscles treated: R TFL  Needle size and number: .30x47mm  x 2 Electrical stimulation performed: No Parameters: N/A Treatment response/outcome: Twitch response elicited and Palpable decrease in muscle tension Post-treatment instructions: Patient instructed to expect possible mild to moderate muscle soreness later today and/or tomorrow. Patient instructed in methods to reduce muscle soreness and to continue prescribed HEP. If patient was dry needled over the lung field, patient was instructed on signs and symptoms of pneumothorax and, however unlikely, to see immediate medical attention should they occur. Patient was also educated on signs and symptoms of infection and to seek medical attention should they occur. Patient verbalized understanding of these instructions and education.                                                              DATE: 02/24/23 Eval and HEP    PATIENT EDUCATION:  Education details: Discussed eval findings, rehab rationale and POC and patient is in agreement  Person educated: Patient Education method: Explanation Education comprehension: verbalized understanding and needs further education  HOME EXERCISE PROGRAM: Access Code: [email or web address] URL: [email or web address] Date: 03/17/2023 Prepared by: Edwinna Areola  Exercises - Hip Flexor Stretch at [facility] of Bed  - 2 x daily - 5 x weekly - 1 sets - 2 reps - 30s hold - Supine Piriformis Stretch with Foot on Ground  - 2 x daily - 5 x weekly - 1 sets - 2 reps - 30s hold - Clamshell  - 2 x daily - 5 x weekly - 2 sets - 15 reps - Hooklying Single Leg Bent Knee Fallouts with Resistance  - 2 x daily - 5 x weekly - 2 sets - 15 reps - Supine Bridge  with Resistance Band  - 2 x daily - 5 x weekly - 2 sets - 15 reps - Sidelying Hip Abduction  - 1 x daily - 7 x weekly - 2 sets - 10 reps  ASSESSMENT:  CLINICAL IMPRESSION:  Pt was able to complete all prescribed exercises with only mild R hip discomfort. Responded well to manual therapy interventions, noting slight decrease in pain post session. Therapy focused on improving lateral and anterior hip strength. HEP updated for continued proximal hip strengthening, will continue to progress per POC as tolerated.   (EVAL): Patient is a 62 y.o. male who was seen today for physical therapy evaluation and treatment for R hip pain. Patient demonstrates only mild strength deficits in R hip.  Hamstring, ITB, hip flexor stretching unremarkable.  Palpation finds active trigger points in rectus femoris and TFL on R.  5x STS time WFL, mild discomfort with FAI stressing.  OBJECTIVE IMPAIRMENTS: Abnormal gait.   ACTIVITY LIMITATIONS: sitting, standing, and prolonged walking  PERSONAL FACTORS: Age, Fitness, and Time since onset of injury/illness/exacerbation are also affecting patient's functional outcome.    GOALS: Goals reviewed with patient? No  SHORT TERM GOALS: Target date: 03/17/2023   Patient to demonstrate independence in HEP  Baseline: [id number] Goal status: INITIAL   LONG TERM GOALS: Target date: 04/07/2023    Assess FOTO Baseline: TBD Goal status: INITIAL  2.  Increase R hip strength to 5/5 Baseline:  MMT Right eval Left eval  Hip flexion 4+ 5  Hip extension 4+ 5  Hip abduction 4+ 5   Goal status: INITIAL  3.  Resolve active trigger points in R hip region  Baseline: TTP R TFL and RF Goal status: INITIAL  4.  Decrease worst pain to 2/10 Baseline: 6/10 Goal status: INITIAL   PLAN:  PT FREQUENCY: 1-2x/week  PT DURATION: 6 weeks  PLANNED INTERVENTIONS: Therapeutic exercises, Therapeutic activity, Neuromuscular re-education, Balance training, Gait training, Patient/Family  education, Self Care, Joint mobilization, Stair training, Dry Needling, Electrical stimulation, Spinal mobilization, Cryotherapy, Moist heat, Manual therapy, and Re-evaluation  PLAN FOR NEXT SESSION: HEP review and update, manual techniques as appropriate, aerobic tasks, ROM and flexibility activities, strengthening and PREs, TPDN, gait and balance training as needed     Eloy End, PT 03/17/2023, 2:56 PM

## 2023-03-23 NOTE — Therapy (Unsigned)
OUTPATIENT PHYSICAL THERAPY TREATMENT   Patient Name: Alfred Delgado MRN: [id number] DOB:1961-04-04, 62 y.o., male Today's Date: 03/24/2023  END OF SESSION:  PT End of Session - 03/24/23 0750     Visit Number 4    Number of Visits 6    Date for PT Re-Evaluation 04/26/23    Authorization Type U[employer]    PT Start Time 0745    PT Stop Time 0825    PT Time Calculation (min) 40 min    Activity Tolerance Patient tolerated treatment well    Behavior During Therapy [facility] for tasks assessed/performed                Past Medical History:  Diagnosis Date   Allergy    Anemia    Asthma    BELCHING 07/23/2010   Qualifier: Diagnosis of  By: Felicity Coyer MD, Raenette Rover    CHEST PAIN 06/25/2010   Qualifier: Diagnosis of  By: Felicity Coyer MD, Vikki Ports A    DVT of lower extremity (deep venous thrombosis) (HCC)    History of colon polyps    History of colonic diverticulitis    LUQ PAIN 06/25/2010   Qualifier: Diagnosis of  By: Felicity Coyer MD, Vikki Ports A    Multiple lipomas    History of   NEOPLASM, MALIGNANT, ESOPHAGUS 08/30/2010   Past Surgical History:  Procedure Laterality Date   COLONOSCOPY  last 2013   ESOPHAGOGASTRECTOMY  2012   Dr. Arbie Cookey   POLYPECTOMY     PORTA CATH REMOVAL     PORTACATH PLACEMENT  03/14/2011   TONSILLECTOMY     UPPER GASTROINTESTINAL ENDOSCOPY     Patient Active Problem List   Diagnosis Date Noted   Chronic right hip pain 02/20/2023   Uncomplicated varicose veins 12/23/2022   Piriformis syndrome of right side 12/23/2022   Mood changes 12/23/2022   Hx of iron deficiency anemia 05/23/2021   Positive depression screening 05/23/2021   H/O therapeutic radiation 02/24/2018   History of chemotherapy 02/24/2018   Encounter for annual physical exam 02/16/2015   History of esophageal cancer 08/30/2010   GERD 07/23/2010   UNSPECIFIED HYPOTENSION 06/25/2010   ALLERGIC RHINITIS 06/25/2010   ASTHMA 06/25/2010   COLONIC POLYPS, HX OF 06/25/2010   DIVERTICULITIS, HX OF  06/25/2010    PCP: Tollie Eth, NP  REFERRING PROVIDER: Tollie Eth, NP  REFERRING DIAG: [phone] (ICD-10-CM) - Chronic right hip pain  THERAPY DIAG:  Pain in right hip  Other abnormalities of gait and mobility  Muscle weakness (generalized)  Rationale for Evaluation and Treatment: Rehabilitation  ONSET DATE: chronic  SUBJECTIVE:   SUBJECTIVE STATEMENT:2 days relief following last session but symptoms returned w/o inciting incident.  Continues to report symptoms with prolonged positioning and entering/exiting car.  PERTINENT HISTORY: See PMH  PAIN:  Are you having pain?  Yes: NPRS scale: 6/10 Pain location: R hip anteriorly Pain description: ache Aggravating factors: prolonged standing Relieving factors: aleve/  PRECAUTIONS: None  RED FLAGS: None   WEIGHT BEARING RESTRICTIONS: No  FALLS:  Has patient fallen in last 6 months? No  OCCUPATION: airplane tech  PLOF: Independent  PATIENT GOALS: To reduce and manage my hip pain  NEXT MD VISIT: TBD  OBJECTIVE:   DIAGNOSTIC FINDINGS: CLINICAL DATA:  Right hip and inguinal pain radiating to right knee for 6 months   EXAM: DG HIP (WITH OR WITHOUT PELVIS) 2-3V RIGHT   COMPARISON:  None Available.   FINDINGS: Frontal view of the pelvis as well as frontal  and frogleg lateral views of the right hip are obtained. No acute fracture, subluxation, or dislocation. There is bilateral hip osteoarthritis, moderate on the right and mild on the left. Circumscribed lucency with sclerotic margins within the left femoral neck consistent with synovial herniation, or "Pitt's pit". The remainder of the bony pelvis is unremarkable. Sacroiliac joints are normal.   IMPRESSION: 1. Bilateral hip osteoarthritis, right greater than left. 2. No acute displaced fracture.     Electronically Signed   By: Sharlet Salina M.D.   On: 02/17/2023 19:00  PATIENT SURVEYS:  FOTO TBD  MUSCLE LENGTH: Hamstrings: Right 80  deg; Left 80 deg Thomas test: negative  POSTURE: No Significant postural limitations  PALPATION: TTP R piriformis, hip flexor tendon and TFL  LOWER EXTREMITY ROM: WFL throughout  Active ROM Right eval Left eval  Hip flexion    Hip extension    Hip abduction    Hip adduction    Hip internal rotation    Hip external rotation    Knee flexion    Knee extension    Ankle dorsiflexion    Ankle plantarflexion    Ankle inversion    Ankle eversion     (Blank rows = not tested)  LOWER EXTREMITY MMT:  MMT Right eval Left eval  Hip flexion 4+ 5  Hip extension 4+ 5  Hip abduction 4+ 5  Hip adduction    Hip internal rotation    Hip external rotation    Knee flexion    Knee extension    Ankle dorsiflexion    Ankle plantarflexion    Ankle inversion    Ankle eversion     (Blank rows = not tested)  LOWER EXTREMITY SPECIAL TESTS:  Hip special tests: Luisa Hart (FABER) test: negative, Thomas test: negative, Ober's test: negative, Anterior hip impingement test: positive , and Piriformis test: negative  FUNCTIONAL TESTS:  5 times sit to stand: 10s arms crossed  GAIT: Distance walked: 66ft x2 Assistive device utilized: None Level of assistance: Complete Independence Comments: antalgic   TODAY'S TREATMENT:         [facility] Adult PT Treatment:                                                DATE: 03/24/23 Therapeutic Exercise: Nustep L 5 6 min Prone on elbows 2  Prone press up 10x Plank on knees 30s x2 Plank on knees with LE extension 30s unable to maintain 30s lifting LLE and stabilize on RLE Bridge with ball 15x Supine hip fallouts BluTB 15x B, 15/15 unilaterally S/L clams BluTB 15/15 Bridge against BluTB 15x  Self Care: Discussion of symptom etiology, S&S of FIA and re-assessment with positive FADIR test  [facility] Adult PT Treatment:                                                DATE: 03/17/23 Therapeutic Exercise: Nustep L5 x 3 min while taking subjective S/L clamshell 2x10  GTB Bridge 2x10 GTB S/L hip abd 2x10 each Supine SLR - unable on R LE Manual Therapy: R hip mobilization with movement into IR/ER using manipulation belt Grade II R hip LAD grade III  [facility] Adult PT Treatment:  DATE: 03/03/23 Therapeutic Exercise: Nustep L4 6 min Hip flexor stretch R 30s x1 R piriformis stretch cross body 30s x1 R clams BluTB 15x Bridge with BluTB 15x Manual Therapy: Skilled palpation to identify taught bands in R TFL and rectus femoris Trigger Point Dry Needling Treatment: Pre-treatment instruction: Patient instructed on dry needling rationale, procedures, and possible side effects including pain during treatment (achy,cramping feeling), bruising, drop of blood, lightheadedness, nausea, sweating. Patient Consent Given: Yes Education handout provided: Previously provided Muscles treated: R TFL  Needle size and number: .30x64mm x 2 Electrical stimulation performed: No Parameters: N/A Treatment response/outcome: Twitch response elicited and Palpable decrease in muscle tension Post-treatment instructions: Patient instructed to expect possible mild to moderate muscle soreness later today and/or tomorrow. Patient instructed in methods to reduce muscle soreness and to continue prescribed HEP. If patient was dry needled over the lung field, patient was instructed on signs and symptoms of pneumothorax and, however unlikely, to see immediate medical attention should they occur. Patient was also educated on signs and symptoms of infection and to seek medical attention should they occur. Patient verbalized understanding of these instructions and education.                                                              DATE: 02/24/23 Eval and HEP    PATIENT EDUCATION:  Education details: Discussed eval findings, rehab rationale and POC and patient is in agreement  Person educated: Patient Education method: Explanation Education  comprehension: verbalized understanding and needs further education  HOME EXERCISE PROGRAM: Access Code: [email or web address] URL: [email or web address] Date: 03/24/2023 Prepared by: Gustavus Bryant  Exercises - Hip Flexor Stretch at [facility] of Bed  - 2 x daily - 5 x weekly - 1 sets - 2 reps - 30s hold - Supine Piriformis Stretch with Foot on Ground  - 2 x daily - 5 x weekly - 1 sets - 2 reps - 30s hold - Clamshell  - 2 x daily - 5 x weekly - 2 sets - 15 reps - Hooklying Single Leg Bent Knee Fallouts with Resistance  - 2 x daily - 5 x weekly - 2 sets - 15 reps - Supine Bridge with Resistance Band  - 2 x daily - 5 x weekly - 2 sets - 15 reps - Sidelying Hip Abduction  - 1 x daily - 7 x weekly - 2 sets - 10 reps - Supine Quadricep Sets  - 1 x daily - 7 x weekly - 2 sets - 10 reps - 5 sec hold - Plank on Knees  - 1 x daily - 7 x weekly - 1 sets - 2 reps - 30s hold  ASSESSMENT:  CLINICAL IMPRESSION: Symptoms persist and fluctuate.  Notes some relief following PT session but symptoms eventually return. Focus of today was more challenging stabilization exercises using planks, unable to stabilize on RLE.  No lasting relief of symptoms to date.  Marked weakness when attempting to stabilize on RLE.  (EVAL): Patient is a 62 y.o. male who was seen today for physical therapy evaluation and treatment for R hip pain. Patient demonstrates only mild strength deficits in R hip.  Hamstring, ITB, hip flexor stretching unremarkable.  Palpation finds active trigger points in rectus femoris and TFL on R.  5x STS time  WFL, mild discomfort with FAI stressing.  OBJECTIVE IMPAIRMENTS: Abnormal gait.   ACTIVITY LIMITATIONS: sitting, standing, and prolonged walking  PERSONAL FACTORS: Age, Fitness, and Time since onset of injury/illness/exacerbation are also affecting patient's functional outcome.    GOALS: Goals reviewed with patient? No  SHORT TERM GOALS: Target date: 03/17/2023   Patient to demonstrate  independence in HEP  Baseline: [id number] Goal status: INITIAL   LONG TERM GOALS: Target date: 04/07/2023    Assess FOTO Baseline: TBD Goal status: INITIAL  2.  Increase R hip strength to 5/5 Baseline:  MMT Right eval Left eval  Hip flexion 4+ 5  Hip extension 4+ 5  Hip abduction 4+ 5   Goal status: INITIAL  3.  Resolve active trigger points in R hip region Baseline: TTP R TFL and RF Goal status: INITIAL  4.  Decrease worst pain to 2/10 Baseline: 6/10 Goal status: INITIAL   PLAN:  PT FREQUENCY: 1-2x/week  PT DURATION: 6 weeks  PLANNED INTERVENTIONS: Therapeutic exercises, Therapeutic activity, Neuromuscular re-education, Balance training, Gait training, Patient/Family education, Self Care, Joint mobilization, Stair training, Dry Needling, Electrical stimulation, Spinal mobilization, Cryotherapy, Moist heat, Manual therapy, and Re-evaluation  PLAN FOR NEXT SESSION: HEP review and update, manual techniques as appropriate, aerobic tasks, ROM and flexibility activities, strengthening and PREs, TPDN, gait and balance training as needed     Hildred Laser, PT 03/24/2023, 9:36 AM

## 2023-03-24 ENCOUNTER — Ambulatory Visit: Payer: 59

## 2023-03-24 DIAGNOSIS — M25551 Pain in right hip: Secondary | ICD-10-CM

## 2023-03-24 DIAGNOSIS — M6281 Muscle weakness (generalized): Secondary | ICD-10-CM

## 2023-03-24 DIAGNOSIS — R2689 Other abnormalities of gait and mobility: Secondary | ICD-10-CM

## 2023-03-31 ENCOUNTER — Ambulatory Visit: Payer: 59

## 2023-03-31 DIAGNOSIS — M25551 Pain in right hip: Secondary | ICD-10-CM | POA: Diagnosis not present

## 2023-03-31 DIAGNOSIS — R2689 Other abnormalities of gait and mobility: Secondary | ICD-10-CM

## 2023-03-31 NOTE — Therapy (Signed)
OUTPATIENT PHYSICAL THERAPY TREATMENT   Patient Name: Alfred Delgado MRN: [id number] DOB:1961-06-17, 62 y.o., male Today's Date: 03/31/2023  END OF SESSION:  PT End of Session - 03/31/23 0758     Visit Number 5    Number of Visits 6    Date for PT Re-Evaluation 04/26/23    Authorization Type [employer]    PT Start Time 0800    PT Stop Time 0841    PT Time Calculation (min) 41 min    Activity Tolerance Patient tolerated treatment well    Behavior During Therapy [facility] for tasks assessed/performed                 Past Medical History:  Diagnosis Date   Allergy    Anemia    Asthma    BELCHING 07/23/2010   Qualifier: Diagnosis of  By: Felicity Coyer MD, Raenette Rover    CHEST PAIN 06/25/2010   Qualifier: Diagnosis of  By: Felicity Coyer MD, Vikki Ports A    DVT of lower extremity (deep venous thrombosis) (HCC)    History of colon polyps    History of colonic diverticulitis    LUQ PAIN 06/25/2010   Qualifier: Diagnosis of  By: Felicity Coyer MD, Vikki Ports A    Multiple lipomas    History of   NEOPLASM, MALIGNANT, ESOPHAGUS 08/30/2010   Past Surgical History:  Procedure Laterality Date   COLONOSCOPY  last 2013   ESOPHAGOGASTRECTOMY  2012   Dr. Arbie Cookey   POLYPECTOMY     PORTA CATH REMOVAL     PORTACATH PLACEMENT  03/14/2011   TONSILLECTOMY     UPPER GASTROINTESTINAL ENDOSCOPY     Patient Active Problem List   Diagnosis Date Noted   Chronic right hip pain 02/20/2023   Uncomplicated varicose veins 12/23/2022   Piriformis syndrome of right side 12/23/2022   Mood changes 12/23/2022   Hx of iron deficiency anemia 05/23/2021   Positive depression screening 05/23/2021   H/O therapeutic radiation 02/24/2018   History of chemotherapy 02/24/2018   Encounter for annual physical exam 02/16/2015   History of esophageal cancer 08/30/2010   GERD 07/23/2010   UNSPECIFIED HYPOTENSION 06/25/2010   ALLERGIC RHINITIS 06/25/2010   ASTHMA 06/25/2010   COLONIC POLYPS, HX OF 06/25/2010   DIVERTICULITIS, HX OF  06/25/2010    PCP: Tollie Eth, NP  REFERRING PROVIDER: Tollie Eth, NP  REFERRING DIAG: [phone] (ICD-10-CM) - Chronic right hip pain  THERAPY DIAG:  Pain in right hip  Other abnormalities of gait and mobility  Rationale for Evaluation and Treatment: Rehabilitation  ONSET DATE: chronic  SUBJECTIVE:   SUBJECTIVE STATEMENT:  Pt presents to PT with continued symptoms in R hip pain. Notes that his pain with walking has started to increase lately. Twisting when getting into the car also seems to be getting more painful.   PERTINENT HISTORY: See PMH  PAIN:  Are you having pain?  Yes: NPRS scale: 6/10 Pain location: R hip anteriorly Pain description: ache Aggravating factors: prolonged standing Relieving factors: aleve/  PRECAUTIONS: None  RED FLAGS: None   WEIGHT BEARING RESTRICTIONS: No  FALLS:  Has patient fallen in last 6 months? No  OCCUPATION: airplane tech  PLOF: Independent  PATIENT GOALS: To reduce and manage my hip pain  NEXT MD VISIT: TBD  OBJECTIVE:   DIAGNOSTIC FINDINGS: CLINICAL DATA:  Right hip and inguinal pain radiating to right knee for 6 months   EXAM: DG HIP (WITH OR WITHOUT PELVIS) 2-3V RIGHT   COMPARISON:  None Available.  FINDINGS: Frontal view of the pelvis as well as frontal and frogleg lateral views of the right hip are obtained. No acute fracture, subluxation, or dislocation. There is bilateral hip osteoarthritis, moderate on the right and mild on the left. Circumscribed lucency with sclerotic margins within the left femoral neck consistent with synovial herniation, or "Pitt's pit". The remainder of the bony pelvis is unremarkable. Sacroiliac joints are normal.   IMPRESSION: 1. Bilateral hip osteoarthritis, right greater than left. 2. No acute displaced fracture.     Electronically Signed   By: Sharlet Salina M.D.   On: 02/17/2023 19:00  PATIENT SURVEYS:  FOTO TBD  MUSCLE LENGTH: Hamstrings: Right 80  deg; Left 80 deg Thomas test: negative  POSTURE: No Significant postural limitations  PALPATION: TTP R piriformis, hip flexor tendon and TFL  LOWER EXTREMITY ROM: WFL throughout  Active ROM Right eval Left eval  Hip flexion    Hip extension    Hip abduction    Hip adduction    Hip internal rotation    Hip external rotation    Knee flexion    Knee extension    Ankle dorsiflexion    Ankle plantarflexion    Ankle inversion    Ankle eversion     (Blank rows = not tested)  LOWER EXTREMITY MMT:  MMT Right eval Left eval  Hip flexion 4+ 5  Hip extension 4+ 5  Hip abduction 4+ 5  Hip adduction    Hip internal rotation    Hip external rotation    Knee flexion    Knee extension    Ankle dorsiflexion    Ankle plantarflexion    Ankle inversion    Ankle eversion     (Blank rows = not tested)  LOWER EXTREMITY SPECIAL TESTS:  Hip special tests: Luisa Hart (FABER) test: negative, Thomas test: negative, Ober's test: negative, Anterior hip impingement test: positive , and Piriformis test: negative  FUNCTIONAL TESTS:  5 times sit to stand: 10s arms crossed  GAIT: Distance walked: 24ft x2 Assistive device utilized: None Level of assistance: Complete Independence Comments: antalgic   TODAY'S TREATMENT:         [facility] Adult PT Treatment:                                                DATE: 03/31/23 Therapeutic Exercise: Nustep L5 x 4 min while taking subjective Bridge with GTB 2x10 Sidelying clamshell 2x15 GTB Lateral walk at counter RTB x 3 laps Standing hip abd/ext 2x10 RTB  [facility] Adult PT Treatment:                                                DATE: 03/24/23 Therapeutic Exercise: Nustep L 5 6 min Prone on elbows 2  Prone press up 10x Plank on knees 30s x2 Plank on knees with LE extension 30s unable to maintain 30s lifting LLE and stabilize on RLE Bridge with ball 15x Supine hip fallouts BluTB 15x B, 15/15 unilaterally S/L clams BluTB 15/15 Bridge against BluTB  15x Self Care: Discussion of symptom etiology, S&S of FIA and re-assessment with positive FADIR test  [facility] Adult PT Treatment:  DATE: 03/17/23 Therapeutic Exercise: Nustep L5 x 3 min while taking subjective S/L clamshell 2x10 GTB Bridge 2x10 GTB S/L hip abd 2x10 each Supine SLR - unable on R LE Manual Therapy: R hip mobilization with movement into IR/ER using manipulation belt Grade II R hip LAD grade III   PATIENT EDUCATION:  Education details: continue HEP Person educated: Patient Education method: Explanation Education comprehension: verbalized understanding and needs further education  HOME EXERCISE PROGRAM: Access Code: [email or web address] URL: [email or web address] Date: 03/31/2023 Prepared by: Edwinna Areola  Exercises - Hip Flexor Stretch at [facility] of Bed  - 2 x daily - 5 x weekly - 1 sets - 2 reps - 30s hold - Supine Piriformis Stretch with Foot on Ground  - 2 x daily - 5 x weekly - 1 sets - 2 reps - 30s hold - Clamshell  - 2 x daily - 5 x weekly - 2 sets - 15 reps - Hooklying Single Leg Bent Knee Fallouts with Resistance  - 2 x daily - 5 x weekly - 2 sets - 15 reps - Supine Bridge with Resistance Band  - 2 x daily - 5 x weekly - 2 sets - 15 reps - Sidelying Hip Abduction  - 1 x daily - 7 x weekly - 2 sets - 10 reps - Supine Quadricep Sets  - 1 x daily - 7 x weekly - 2 sets - 10 reps - 5 sec hold - Plank on Knees  - 1 x daily - 7 x weekly - 1 sets - 2 reps - 30s hold - Side Stepping with Resistance at Ankles and Counter Support  - 1 x daily - 7 x weekly - 3 sets - red band hold  ASSESSMENT:  CLINICAL IMPRESSION:  Pt was able to complete all prescribed exercises with slight R hip pain today during treatment. Therapy today focused on proximal hip strengthening with emphasis on lateral side. Continues to be limited in work and daily activities secondary to hip pain. Will currently continue per POC, however, pt would soon  benefit from seeing orthopedics in future.   (EVAL): Patient is a 62 y.o. male who was seen today for physical therapy evaluation and treatment for R hip pain. Patient demonstrates only mild strength deficits in R hip.  Hamstring, ITB, hip flexor stretching unremarkable.  Palpation finds active trigger points in rectus femoris and TFL on R.  5x STS time WFL, mild discomfort with FAI stressing.  OBJECTIVE IMPAIRMENTS: Abnormal gait.   ACTIVITY LIMITATIONS: sitting, standing, and prolonged walking  PERSONAL FACTORS: Age, Fitness, and Time since onset of injury/illness/exacerbation are also affecting patient's functional outcome.    GOALS: Goals reviewed with patient? No  SHORT TERM GOALS: Target date: 03/17/2023   Patient to demonstrate independence in HEP  Baseline: [id number] Goal status: MET   LONG TERM GOALS: Target date: 04/07/2023    Assess FOTO Baseline: TBD Goal status: INITIAL  2.  Increase R hip strength to 5/5 Baseline:  MMT Right eval Left eval  Hip flexion 4+ 5  Hip extension 4+ 5  Hip abduction 4+ 5   Goal status: INITIAL  3.  Resolve active trigger points in R hip region Baseline: TTP R TFL and RF Goal status: INITIAL  4.  Decrease worst pain to 2/10 Baseline: 6/10 Goal status: INITIAL   PLAN:  PT FREQUENCY: 1-2x/week  PT DURATION: 6 weeks  PLANNED INTERVENTIONS: Therapeutic exercises, Therapeutic activity, Neuromuscular re-education, Balance training, Gait training, Patient/Family education, Self Care, Joint mobilization, Stair training,  Dry Needling, Electrical stimulation, Spinal mobilization, Cryotherapy, Moist heat, Manual therapy, and Re-evaluation  PLAN FOR NEXT SESSION: HEP review and update, manual techniques as appropriate, aerobic tasks, ROM and flexibility activities, strengthening and PREs, TPDN, gait and balance training as needed     Eloy End, PT 03/31/2023, 8:41 AM

## 2023-04-07 ENCOUNTER — Encounter: Payer: Self-pay | Admitting: Nurse Practitioner

## 2023-04-07 ENCOUNTER — Ambulatory Visit: Payer: 59

## 2023-04-07 DIAGNOSIS — M25551 Pain in right hip: Secondary | ICD-10-CM

## 2023-04-07 DIAGNOSIS — R2689 Other abnormalities of gait and mobility: Secondary | ICD-10-CM

## 2023-04-07 DIAGNOSIS — M6281 Muscle weakness (generalized): Secondary | ICD-10-CM

## 2023-04-08 ENCOUNTER — Other Ambulatory Visit: Payer: Self-pay

## 2023-04-08 DIAGNOSIS — G8929 Other chronic pain: Secondary | ICD-10-CM

## 2023-04-10 ENCOUNTER — Encounter: Payer: Self-pay | Admitting: Physician Assistant

## 2023-04-10 ENCOUNTER — Ambulatory Visit: Payer: 59 | Admitting: Physician Assistant

## 2023-04-10 DIAGNOSIS — G8929 Other chronic pain: Secondary | ICD-10-CM | POA: Diagnosis not present

## 2023-04-10 DIAGNOSIS — M25551 Pain in right hip: Secondary | ICD-10-CM | POA: Diagnosis not present

## 2023-04-10 NOTE — Progress Notes (Signed)
Office Visit Note   Patient: Alfred Delgado           Date of Birth: 1960-10-26           MRN: [id number] Visit Date: 04/10/2023              Requested by: Tollie Eth, NP [address] [place],  [place] [address] PCP: Tollie Eth, NP   Assessment & Plan: Visit Diagnoses:  1. Chronic right hip pain     Plan: Pleasant 62 year old gentleman with a 1 year history of right hip pain.  No known injury.  He has a history of smoking but quit many years ago also history of alcohol use which she quit many years ago.  No injuries.  X-rays show moderate degenerative changes of the right hip joint.  He does have focal pain in his groin and I do think this is from his hip.  Also stiffness.  He did do physical therapy feels that this has made it worse.  We talked about conservative versus surgical treatment.  Patient is not sure what he wants to do.  It does awaken him at night but has not tried any injections.  His wife inquired about PRP versus steroid injections.  She was a little hesitant to go forward with steroid injections.  She asked several questions about PRP.  I have recommended to refer them to Dr. Shon Baton for further discussion and possible PRP versus steroid injection into his right hip.  Also offered them to be referred to one of our joint surgeons.  Rates his pain is moderate to severe  Follow-Up Instructions: No follow-ups on file.   Orders:  No orders of the defined types were placed in this encounter.  No orders of the defined types were placed in this encounter.     Procedures: No procedures performed   Clinical Data: No additional findings.   Subjective: Chief Complaint  Patient presents with   Right Hip - Pain    HPI Pleasant active 62 year old gentleman with a 1 year history of right hip pain.  No radicular or paresthesias.  Pain is in the groin and can radiate down to his knee.  Rates the pain is moderate.  Has tried physical therapy which made it  worse. Review of Systems  All other systems reviewed and are negative.   Objective: Vital Signs: There were no vitals taken for this visit.  Physical Exam Constitutional:      Appearance: Normal appearance.  Pulmonary:     Effort: Pulmonary effort is normal.  Skin:    General: Skin is warm and dry.  Neurological:     Mental Status: He is alert.  Psychiatric:        Mood and Affect: Mood normal.        Behavior: Behavior normal.   Ortho Exam Right hip he is neurovascularly intact.  No back pain or buttock pain he has good strength.  He is focally tender in the groin this is extremely increased with manipulation internal/external rotation of his hip.  Compartments are soft nontender Specialty Comments:  No specialty comments available.  Imaging: No results found.   PMFS History: Patient Active Problem List   Diagnosis Date Noted   Chronic right hip pain 02/20/2023   Uncomplicated varicose veins 12/23/2022   Piriformis syndrome of right side 12/23/2022   Mood changes 12/23/2022   Hx of iron deficiency anemia 05/23/2021   Positive depression screening 05/23/2021   H/O therapeutic radiation  02/24/2018   History of chemotherapy 02/24/2018   Encounter for annual physical exam 02/16/2015   History of esophageal cancer 08/30/2010   GERD 07/23/2010   UNSPECIFIED HYPOTENSION 06/25/2010   Allergic rhinitis 06/25/2010   Asthma 06/25/2010   History of colonic polyps 06/25/2010   DIVERTICULITIS, HX OF 06/25/2010   Past Medical History:  Diagnosis Date   Allergy    Anemia    Asthma    BELCHING 07/23/2010   Qualifier: Diagnosis of  By: Felicity Coyer MD, Raenette Rover    CHEST PAIN 06/25/2010   Qualifier: Diagnosis of  By: Felicity Coyer MD, Vikki Ports A    DVT of lower extremity (deep venous thrombosis) (HCC)    History of colon polyps    History of colonic diverticulitis    LUQ PAIN 06/25/2010   Qualifier: Diagnosis of  By: Felicity Coyer MD, Vikki Ports A    Multiple lipomas    History of    NEOPLASM, MALIGNANT, ESOPHAGUS 08/30/2010    Family History  Problem Relation Age of Onset   Breast cancer Mother    Thyroid disease Mother    Throat cancer Father    Colon polyps Father    Esophageal cancer Father    Thyroid disease Maternal Grandmother    Cancer Paternal Grandmother    Heart disease Paternal Grandfather    Colon cancer Neg Hx    Rectal cancer Neg Hx    Stomach cancer Neg Hx     Past Surgical History:  Procedure Laterality Date   COLONOSCOPY  last 2013   ESOPHAGOGASTRECTOMY  2012   Dr. Arbie Cookey   POLYPECTOMY     PORTA CATH REMOVAL     PORTACATH PLACEMENT  03/14/2011   TONSILLECTOMY     UPPER GASTROINTESTINAL ENDOSCOPY     Social History   Occupational History   Occupation: Barrister's clerk   Tobacco Use   Smoking status: Former    Current packs/day: 0.00    Average packs/day: 1.5 packs/day for 20.0 years (30.0 ttl pk-yrs)    Types: Cigarettes    Start date: 07/08/1977    Quit date: 07/08/1997    Years since quitting: 25.7   Smokeless tobacco: Never  Vaping Use   Vaping status: Never Used  Substance and Sexual Activity   Alcohol use: No    Alcohol/week: 0.0 standard drinks of alcohol   Drug use: No   Sexual activity: Not on file

## 2023-04-14 ENCOUNTER — Ambulatory Visit: Payer: 59

## 2023-04-18 ENCOUNTER — Encounter: Payer: Self-pay | Admitting: Sports Medicine

## 2023-04-18 ENCOUNTER — Ambulatory Visit: Payer: 59 | Admitting: Sports Medicine

## 2023-04-18 DIAGNOSIS — G8929 Other chronic pain: Secondary | ICD-10-CM

## 2023-04-18 DIAGNOSIS — M160 Bilateral primary osteoarthritis of hip: Secondary | ICD-10-CM

## 2023-04-18 DIAGNOSIS — M25551 Pain in right hip: Secondary | ICD-10-CM

## 2023-04-18 DIAGNOSIS — Z87898 Personal history of other specified conditions: Secondary | ICD-10-CM | POA: Diagnosis not present

## 2023-04-18 NOTE — Patient Instructions (Signed)
Dr. Shon Baton' Instructions and What to Expect for PRP Injections:  Platelet-rich plasma is used in musculoskeletal medicine to focus your own body's ability to heal. It has several well-done published research trials (RCT) which demonstrate both its effectiveness and safety in many musculoskeletal conditions, including osteoarthritis, tendinopathies, partial tendon tears, and damaged vertebral discs. PRP has been in clinical use since the 1990's. Many people know that platelets form a clot if there is a cut in the skin. It turns out that platelets do not only form a clot, but they also start the body's own repair process. When platelets activate to form a clot, they release alpha granules which have hundreds of chemical messengers in them that initiate and organize repair to the damaged tissue. Precisely placing PRP into the site of injury will initiate the healing process by activating on the damaged cartilage, bone or tendon. This is an inflammatory process, and inflammation is the vital first phase of healing.  What to expect and how to prepare for PRP:   Depending on the procedure, you may need to arrange for a driver to bring you home (i.e. procedure on right/driving leg). IF you are having a lower extremity procedure, we can provide crutches as needed, but these are not routinely needed.   10 days prior to the procedure: Stop taking anti-inflammatory drugs like Ibuprofen/Motrin, Advil, Naprosyn, Celebrex, or Meloxicam. Even aspirin should be stopped (but need to discuss this with Dr. Shon Baton and your cardiologist beforehand). Let Dr. Shon Baton know if you have been taking prednisone or other corticosteroids in the last 30 days as this can negatively interfere with the PRP process.   The day before the procedure: thoroughly shower and clean your skin.    The day of the procedure: Wear loose-fitting clothing like sweatpants or shorts. If you are having an upper body procedure wear a top that can button or  zip up. Please show up at least 15 minutes early for your appointment, as we will need to take you back to draw your blood prior to the procedure.    Things to avoid prior to procedure: tobacco/nicotine, alcohol, fatty foods. Tobacco is a potent toxin and its use constricts small blood vessels which are needed for tissue repair. Tobacco/nicotine use will limit the effectiveness of any treatment and stopping tobacco use is one of the single greatest actions you can take to improve your health. Avoid toxins like alcohol, which inhibits and depresses the cells needed for tissue repair.  PRP will initiate healing and a productive inflammation, and PRP therapy will make the body part treated sore for the first 3-4 days up to two weeks. Anti-inflammatory drugs (i.e. ibuprofen, Naprosyn, Celebrex) and corticosteroids such as prednisone can blunt or stop this process, so it is important to not take any anti-inflammatory drugs for 10 days before getting PRP therapy, or for at least two weeks after PRP therapy.   Depending on the body part injected, you may be in a sling or on crutches for several days. Most of the time these are not needed, but it is important to allow time for the body part to rest after the injection. By keeping the body part treated relaxed and not loading the body part the PRP can bind in place and do its job. If you push the body part too early, this may make the PRP less effective.  What happens during the PRP procedure?  Platelet rich plasma is made by taking some of your blood and performing a  two-stage centrifuge process on it to concentrate the PRP. First, your blood is drawn into a syringe with a small amount of anti-coagulant in it (this is to keep the blood from clotting during this process). The amount of blood drawn is usually about 10-30 milliliters, depending on how much PRP is needed for the treatment. Then the blood is transferred in a sterile fashion into a centrifuge tube. It  is then centrifuged for the first cycle where the red blood cells are isolated and discarded. In the second centrifuge cycle, the platelet-rich fraction of the remaining plasma is concentrated and placed in a syringe. The skin at the injection site is numbed with a small amount of topical cooling spray. Dr. Shon Baton will then precisely inject the PRP into the injury site using ultrasound guidance.  What to do after your procedure:   NO anti-inflammatories (Ibuprofen/Motrin, Aleve, Meloxicam, etc.) for 2 weeks after injection. It is OK to use Tylenol only if needed. I can also prescribe you specific medicine to control any discomfort you may have after the procedure if needed.   NO applying ice to the affected area for 2 weeks after the injection. It is OK to use heat.   Rest the affected body part. By keeping the body part treated relaxed and not loading the body part the PRP can bind in place and do its job. Be sure to ask Dr. Shon Baton specific post-injection rest and guidelines to follow following your injection, as these will differ slightly depending on what type of PRP injection you receive. In general:   Allow 3-4 days of rest from physical labor or repetitive activity for the affected body part. It is ok to move the area, but no lifting or strenuous activity. After 3 days, unless otherwise instructed, the treated body part should be used and slowly moved through its full range of motion. It will be sore, but you will not damage it by moving it, in fact it needs to move to heal.   No strenuous activity, lifting weights, or dedicated therapy until the 2-week mark from the injection. After 2 weeks from the injection, this is when it is most advantageous to start physical therapy.   After two weeks, you may begin returning to activity, but it is a good idea to avoid activities that specifically hurt you before being treated. Exercise is vital to good health and finding a way to cross train around your  injury is important not only for your physical health, but your mental health as well. Ask me about cross training options for your injury. Some brief (10 minutes or less) period of heat therapy will not hurt the rehab, but it is not required. Usually, depending on the initial injury, physical therapy is started from two weeks to three weeks after injection. Improvements in pain and function should be expected from 6 weeks to 12 weeks after injection and some injuries may require more than one treatment.

## 2023-04-18 NOTE — Progress Notes (Signed)
Patient says that he has had pain in the right hip for the last year with no prior pain. He says that it is disrupting his sleep.

## 2023-04-18 NOTE — Progress Notes (Signed)
Alfred Delgado - 62 y.o. male MRN [id number]  Date of birth: October 27, 1960  Office Visit Note: Visit Date: 04/18/2023 PCP: Tollie Eth, NP Referred by: Persons, [place], [place]  Subjective: Chief Complaint  Patient presents with   Right Hip - Pain   HPI: Alfred Delgado "Alfred Delgado" is a very pleasant 62 y.o. male who presents today for acute on chronic right hip pain with osteoarthritis. Here with his spouse, Alfred Delgado.  Alfred Delgado has had right hip pain for about 1 year, although he has been progressive and worsened over the last few months.  He has pain in the anterior hip that radiates into the groin.  He noticed this when he gets in and out of a car, has to flex the hip up.  Here recently his pain has been bothering him so much that is giving him difficulty with sleeping at nighttime.  He has taken Aleve as needed without much relief.  Does have a history of alcohol use with dumping syndrome, cautious with NSAIDs given this and his history of GERD.  He previously saw my partner, Macon Large persons.  Discussed possible injection therapy.  He has questions about PRP versus steroid injection.  He has never had an injection into the hip.  Also asking at what rate THA would be considered.  Pertinent ROS were reviewed with the patient and found to be negative unless otherwise specified above in HPI.   Assessment & Plan: Visit Diagnoses:  1. Chronic right hip pain   2. Bilateral primary osteoarthritis of hip   3. History of alcohol use disorder    Plan: Discussed with Alfred Delgado the nature of his acute on chronic right hip pain which is an exacerbation of his chronic underlying osteoarthritis.  His pain has been present for a year but has certainly been progressive and is now bothering him so much with daily activities, significant pain at nighttime.  He has done physical therapy for 6 weeks without any relief.  Anti-inflammatories are minimally helpful.  We had a good discussion regarding corticosteroid  injection versus PRP injection.  I did provide information about the pathophysiology of PRP as well as a pre and postinjection protocol.  He is interested in this, but we discussed trialing a one-time corticosteroid injection first to see that he has a positive response to this before proceeding with PRP injection.  He is agreeable to this, he will read about PRP and my handout and ask questions at his leisure.  He will follow-up sometime in the next 1-2 weeks for R-hip CSI injection.   Follow-up: Return for f/u in next 1-2 weeks for US-guided R-hip inj (30-mins).   Meds & Orders: No orders of the defined types were placed in this encounter.  No orders of the defined types were placed in this encounter.    Procedures: No procedures performed      Clinical History: No specialty comments available.  He reports that he quit smoking about 25 years ago. His smoking use included cigarettes. He started smoking about 45 years ago. He has a 30 pack-year smoking history. He has never used smokeless tobacco.  Recent Labs    12/23/22 1555  HGBA1C 6.2*    Objective:    Physical Exam  Gen: Well-appearing, in no acute distress; non-toxic CV:  Well-perfused. Warm.  Resp: Breathing unlabored on room air; no wheezing. Psych: Fluid speech in conversation; appropriate affect; normal thought process Neuro: Sensation intact throughout. No gross coordination deficits.   Ortho Exam - Right  hip: No bony TTP, no redness swelling or effusion.  There is pain with flexion about the hip.  Significantly positive FADIR test, equivocal FABER test.  There is pain and limitation with internal logroll.   *The left hip moves rather fluidly with mild restriction with internal logroll although negative FADIR and FABER testing.  Imaging:  *Independent review and interpretation of right hip x-rays from 02/11/2023 were performed by myself today.  AP and lateral film was reviewed which shows moderate osteoarthritic change  with joint space narrowing of the right hip and mild to moderate left hip osteoarthritic change.  On the right hip there is a prominent inferior osteophyte off the femoral head.  Mild acetabular sclerosis bilaterally.  No acute fracture noted.  There is a 2.48 cm hyperlucency within the left femoral neck, per radiology read consistent with synovial herniation.  DG Hip Unilat W OR W/O Pelvis 2-3 Views Right CLINICAL DATA:  Right hip and inguinal pain radiating to right knee for 6 months  EXAM: DG HIP (WITH OR WITHOUT PELVIS) 2-3V RIGHT  COMPARISON:  None Available.  FINDINGS: Frontal view of the pelvis as well as frontal and frogleg lateral views of the right hip are obtained. No acute fracture, subluxation, or dislocation. There is bilateral hip osteoarthritis, moderate on the right and mild on the left. Circumscribed lucency with sclerotic margins within the left femoral neck consistent with synovial herniation, or "Pitt's pit". The remainder of the bony pelvis is unremarkable. Sacroiliac joints are normal.  IMPRESSION: 1. Bilateral hip osteoarthritis, right greater than left. 2. No acute displaced fracture.  Electronically Signed   By: Sharlet Salina M.D.   On: 02/17/2023 19:00    Past Medical/Family/Surgical/Social History: Medications & Allergies reviewed per EMR, new medications updated. Patient Active Problem List   Diagnosis Date Noted   Chronic right hip pain 02/20/2023   Uncomplicated varicose veins 12/23/2022   Piriformis syndrome of right side 12/23/2022   Mood changes 12/23/2022   Hx of iron deficiency anemia 05/23/2021   Positive depression screening 05/23/2021   H/O therapeutic radiation 02/24/2018   History of chemotherapy 02/24/2018   Encounter for annual physical exam 02/16/2015   History of esophageal cancer 08/30/2010   GERD 07/23/2010   UNSPECIFIED HYPOTENSION 06/25/2010   Allergic rhinitis 06/25/2010   Asthma 06/25/2010   History of colonic polyps  06/25/2010   DIVERTICULITIS, HX OF 06/25/2010   Past Medical History:  Diagnosis Date   Allergy    Anemia    Asthma    BELCHING 07/23/2010   Qualifier: Diagnosis of  By: Felicity Coyer MD, Raenette Rover    CHEST PAIN 06/25/2010   Qualifier: Diagnosis of  By: Felicity Coyer MD, Vikki Ports A    DVT of lower extremity (deep venous thrombosis) (HCC)    History of colon polyps    History of colonic diverticulitis    LUQ PAIN 06/25/2010   Qualifier: Diagnosis of  By: Felicity Coyer MD, Vikki Ports A    Multiple lipomas    History of   NEOPLASM, MALIGNANT, ESOPHAGUS 08/30/2010   Family History  Problem Relation Age of Onset   Breast cancer Mother    Thyroid disease Mother    Throat cancer Father    Colon polyps Father    Esophageal cancer Father    Thyroid disease Maternal Grandmother    Cancer Paternal Grandmother    Heart disease Paternal Grandfather    Colon cancer Neg Hx    Rectal cancer Neg Hx    Stomach cancer Neg Hx  Past Surgical History:  Procedure Laterality Date   COLONOSCOPY  last 2013   ESOPHAGOGASTRECTOMY  2012   Dr. Arbie Cookey   POLYPECTOMY     PORTA CATH REMOVAL     PORTACATH PLACEMENT  03/14/2011   TONSILLECTOMY     UPPER GASTROINTESTINAL ENDOSCOPY     Social History   Occupational History   Occupation: Barrister's clerk   Tobacco Use   Smoking status: Former    Current packs/day: 0.00    Average packs/day: 1.5 packs/day for 20.0 years (30.0 ttl pk-yrs)    Types: Cigarettes    Start date: 07/08/1977    Quit date: 07/08/1997    Years since quitting: 25.7   Smokeless tobacco: Never  Vaping Use   Vaping status: Never Used  Substance and Sexual Activity   Alcohol use: No    Alcohol/week: 0.0 standard drinks of alcohol   Drug use: No   Sexual activity: Not on file

## 2023-05-02 ENCOUNTER — Ambulatory Visit: Payer: 59 | Admitting: Sports Medicine

## 2023-05-19 ENCOUNTER — Encounter: Payer: Self-pay | Admitting: Sports Medicine

## 2023-05-23 ENCOUNTER — Other Ambulatory Visit: Payer: Self-pay

## 2023-05-23 ENCOUNTER — Encounter: Payer: Self-pay | Admitting: Sports Medicine

## 2023-05-23 ENCOUNTER — Ambulatory Visit (INDEPENDENT_AMBULATORY_CARE_PROVIDER_SITE_OTHER): Payer: 59 | Admitting: Sports Medicine

## 2023-05-23 DIAGNOSIS — G8929 Other chronic pain: Secondary | ICD-10-CM | POA: Diagnosis not present

## 2023-05-23 DIAGNOSIS — M25551 Pain in right hip: Secondary | ICD-10-CM | POA: Diagnosis not present

## 2023-05-23 DIAGNOSIS — M1611 Unilateral primary osteoarthritis, right hip: Secondary | ICD-10-CM | POA: Diagnosis not present

## 2023-05-23 DIAGNOSIS — M25451 Effusion, right hip: Secondary | ICD-10-CM | POA: Diagnosis not present

## 2023-05-23 MED ORDER — METHYLPREDNISOLONE ACETATE 40 MG/ML IJ SUSP
80.0000 mg | INTRAMUSCULAR | Status: AC | PRN
  Administered 2023-05-23: 80 mg via INTRA_ARTICULAR

## 2023-05-23 MED ORDER — LIDOCAINE HCL 1 % IJ SOLN
4.0000 mL | INTRAMUSCULAR | Status: AC | PRN
  Administered 2023-05-23: 4 mL

## 2023-05-23 NOTE — Progress Notes (Signed)
   Procedure Note  Patient: Alfred Delgado             Date of Birth: 01-18-1961           MRN: [id number]             Visit Date: 05/23/2023  Procedures: Visit Diagnoses:  1. Chronic right hip pain   2. Unilateral primary osteoarthritis, right hip   3. Right hip joint effusion    Large Joint Inj: R hip joint on 05/23/2023 8:37 AM Indications: pain Details: 22 G 3.5 in needle, ultrasound-guided anterior approach Medications: 4 mL lidocaine 1 %; 80 mg methylPREDNISolone acetate 40 MG/ML Aspirate: 6 mL clear and yellow Outcome: tolerated well, no immediate complications  Procedure: US-guided intra-articular hip injection, Right After discussion on risks/benefits/indications and informed verbal consent was obtained, a timeout was performed. Patient was lying supine on exam table. The hip was cleaned with betadine and alcohol swabs. Then utilizing ultrasound guidance, the patient's femoral head and neck junction was identified and found to have moderate effusion. Utilizing a 22G, 3.5" needle the effusion was aspirated yielding 6cc of clear yellow fluid. Utilizing sterile needle switch and the same portal, the right hip joint was subsequently injected with 4:2 lidocaine:depomedrol via an in-plane approach with ultrasound visualization of the injectate administered into the hip joint. Patient tolerated procedure well without immediate complications.  Procedure, treatment alternatives, risks and benefits explained, specific risks discussed. Consent was given by the patient. Immediately prior to procedure a time out was called to verify the correct patient, procedure, equipment, support staff and site/side marked as required. Patient was prepped and draped in the usual sterile fashion.     - I evaluated the patient about 10 minutes post-injection and he had excellent improvement in pain and range of motion - he will update me in about 66-month how he is doing - did discuss if helpful, considering  moving forward with PRP (would wait 4-6 weeks from today's injection)  Madelyn Brunner, DO Primary Care Sports Medicine Physician  [facility] - Orthopedics  This note was dictated using Dragon naturally speaking software and may contain errors in syntax, spelling, or content which have not been identified prior to signing this note.

## 2023-06-16 ENCOUNTER — Encounter: Payer: Self-pay | Admitting: Sports Medicine

## 2023-07-25 ENCOUNTER — Encounter: Payer: Self-pay | Admitting: Sports Medicine

## 2023-07-25 ENCOUNTER — Other Ambulatory Visit: Payer: Self-pay

## 2023-07-25 ENCOUNTER — Ambulatory Visit: Payer: 59 | Admitting: Sports Medicine

## 2023-07-25 DIAGNOSIS — M25551 Pain in right hip: Secondary | ICD-10-CM

## 2023-07-25 DIAGNOSIS — G8929 Other chronic pain: Secondary | ICD-10-CM

## 2023-07-25 DIAGNOSIS — M1611 Unilateral primary osteoarthritis, right hip: Secondary | ICD-10-CM

## 2023-07-25 NOTE — Progress Notes (Addendum)
   Procedure Note  Patient: Alfred Delgado             Date of Birth: 04/18/1961           MRN: [id number]             Visit Date: 07/25/2023  Procedures: Visit Diagnoses:  1. Chronic right hip pain   2. Unilateral primary osteoarthritis, right hip    Ultrasound-guided PRP Hip Injection, Right: After discussion on risks/benefits/indications, informed verbal consent was obtained and a timeout was performed. The patient was lying supine on exam table. The hip was cleaned with ChloraPrep and alcohol swabs. The overlying soft tissue only was anesthetized with 3 cc of lidocaine 1%. Utilizing ultrasound guidance with a 22G, 3.5" needle the patient's femoral head and neck junction was identified and the intra-articular hip joint was first aspirated yielding 18 cc of clear yellow fluid. Following this, utilizing a sterile syringe swap and via the same portal the hip joint was subsequently injected with approximately 6 cc of platelet-rich-plasma (leukocyte-poor).  Visualization of the injectate spreading within the hip joint was visualized dynamically under ultrasound guidance. Patient tolerated procedure well without immediate complication.  Band-Aid was applied.   Kit: RegenLab: RegenPlasma; BCT-3 kit   *Post-PRP Injection Guidelines: No anti-inflammatories (ibuprofen/motrin, aleve, meloxicam, etc.) for 2 weeks.  No ice for 2 weeks.  Short prescription of tramadol may be written if pain is severe, he will call if needed. Appropriate rest / bracing / and timeframe for beginning therapy discussed and patient endorsed understanding.  - did ask questions regarding contralateral hip for PRP and prevention - he may think about this and consider if wants to move forward for L-hip - f/u in 10 weeks for re-evaluation of hip - begin HEP 2-weeks from injection  Madelyn Brunner, DO Primary Care Sports Medicine Physician  [facility] - Orthopedics  This note was dictated using Dragon naturally speaking  software and may contain errors in syntax, spelling, or content which have not been identified prior to signing this note.

## 2023-09-08 ENCOUNTER — Encounter: Payer: Self-pay | Admitting: Sports Medicine

## 2023-10-26 ENCOUNTER — Encounter: Payer: Self-pay | Admitting: Sports Medicine

## 2023-11-19 ENCOUNTER — Encounter: Payer: Self-pay | Admitting: Orthopaedic Surgery

## 2023-11-19 ENCOUNTER — Ambulatory Visit: Admitting: Orthopaedic Surgery

## 2023-11-19 VITALS — Ht 72.05 in | Wt 197.0 lb

## 2023-11-19 DIAGNOSIS — M1611 Unilateral primary osteoarthritis, right hip: Secondary | ICD-10-CM | POA: Insufficient documentation

## 2023-11-19 DIAGNOSIS — M25551 Pain in right hip: Secondary | ICD-10-CM | POA: Diagnosis not present

## 2023-11-19 DIAGNOSIS — G8929 Other chronic pain: Secondary | ICD-10-CM

## 2023-11-19 NOTE — Progress Notes (Signed)
 The patient is a 63 year old gentleman that I am seeing for the first time and he sent to me by my partner Dr. Shauna Del to discuss hip replacement surgery for his right hip.  He has had a long history of hip pain for well over 12 months now and right groin pain.  He has stiffness in that right hip and pain throughout the day especially with weightbearing activities.  He has had at least 2 intra-articular injections of the steroid in the right hip joint under ultrasound and those to help temporize his pain.  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  He walks with a significant limp and this is affecting his posture.  He does have a history of esophageal cancer which has been cured.  He is not a diabetic.  He is not obese.  He wishes to discuss hip replacement surgery at this point given the failure of conservative treatment for over 12 months.  On exam his right hip is significantly stiff with internal and external rotation and severe pain in the groin with rotation.  His left hip moves smoothly and fluidly.  An AP pelvis and lateral of the right hip is reviewed with him and it shows severe arthritis of the right hip with significant joint space narrowing which is about bone-on-bone with particular osteophytes as well.  The left hip joint space is well-maintained.  We had a long and thorough discussion about hip replacement surgery.  I discussed the risks and benefits of the surgery and what to expect from an intraoperative and postoperative standpoint.  Therapy would no longer help or injections on his right hip at this point given the severity of his arthritis.  I gave him a hand about hip replacement surgery and went over his x-rays and discussed what to expect throughout this course of getting him set up for surgery and  postoperatively.  All questions and concerns were answered and addressed.  Will work on getting him scheduled for right total hip arthroplasty.

## 2023-11-27 ENCOUNTER — Telehealth: Payer: Self-pay

## 2023-11-27 NOTE — Telephone Encounter (Signed)
 I called patient to discuss scheduling THA.  Left voice mail message for patient to return my call.

## 2023-12-19 NOTE — Patient Instructions (Signed)
 SURGICAL WAITING ROOM VISITATION  Patients having surgery or a procedure may have no more than 2 support people in the waiting area - these visitors may rotate.    Children under the age of 74 must have an adult with them who is not the patient.  Visitors with respiratory illnesses are discouraged from visiting and should remain at home.  If the patient needs to stay at the hospital during part of their recovery, the visitor guidelines for inpatient rooms apply. Pre-op nurse will coordinate an appropriate time for 1 support person to accompany patient in pre-op.  This support person may not rotate.    Please refer to the [facility] website for the visitor guidelines for Inpatients (after your surgery is over and you are in a regular room).       Your procedure is scheduled on: 01/02/24   Report to [facility] Main Entrance    Report to admitting at 10:45 AM   Call this number if you have problems the morning of surgery [phone]   Do not eat food :After Midnight.   After Midnight you may have the following liquids until 10:15 AM DAY OF SURGERY  Water Non-Citrus Juices (without pulp, NO RED-Apple, White grape, White cranberry) Black Coffee (NO MILK/CREAM OR CREAMERS, sugar ok)  Clear Tea (NO MILK/CREAM OR CREAMERS, sugar ok) regular and decaf                             Plain Jell-O (NO RED)                                           Fruit ices (not with fruit pulp, NO RED)                                     Popsicles (NO RED)                                                               Sports drinks like Gatorade (NO RED)                  The day of surgery:  Drink ONE (1) Pre-Surgery G2 at  10:15 AM the morning of surgery. Drink in one sitting. Do not sip.  This drink was given to you during your hospital  pre-op appointment visit. Nothing else to drink after completing the  Pre-Surgery G2.     Oral Hygiene is also important to reduce your risk of infection.                                     Remember - BRUSH YOUR TEETH THE MORNING OF SURGERY WITH YOUR REGULAR TOOTHPASTE   Stop all vitamins and herbal supplements 7 days before surgery.   Take these medicines the morning of surgery with A SIP OF WATER: inhaler             You may not have any metal on your  body including hair pins, jewelry, and body piercing             Do not wear make-up, lotions, powders, perfumes/cologne, or deodorant              Men may shave face and neck.   Do not bring valuables to the hospital.  IS NOT             RESPONSIBLE   FOR VALUABLES.   Contacts, glasses, dentures or bridgework may not be worn into surgery.   Bring small overnight bag day of surgery.   DO NOT BRING YOUR HOME MEDICATIONS TO THE HOSPITAL. PHARMACY WILL DISPENSE MEDICATIONS LISTED ON YOUR MEDICATION LIST TO YOU DURING YOUR ADMISSION IN THE HOSPITAL!    Patients discharged on the day of surgery will not be allowed to drive home.  Someone NEEDS to stay with you for the first 24 hours after anesthesia.   Special Instructions: Bring a copy of your healthcare power of attorney and living will documents the day of surgery if you haven't scanned them before.              Please read over the following fact sheets you were given: IF YOU HAVE QUESTIONS ABOUT YOUR PRE-OP INSTRUCTIONS PLEASE CALL [phone] Alfred Delgado   If you received a COVID test during your pre-op visit  it is requested that you wear a mask when out in public, stay away from anyone that may not be feeling well and notify your surgeon if you develop symptoms. If you test positive for Covid or have been in contact with anyone that has tested positive in the last 10 days please notify you surgeon.      Pre-operative 5 CHG Bath Instructions   You can play a key role in reducing the risk of infection after surgery. Your skin needs to be as free of germs as possible. You can reduce the number of germs on your skin by washing  with CHG (chlorhexidine gluconate) soap before surgery. CHG is an antiseptic soap that kills germs and continues to kill germs even after washing.   DO NOT use if you have an allergy to chlorhexidine/CHG or antibacterial soaps. If your skin becomes reddened or irritated, stop using the CHG and notify one of our RNs at [phone].   Please shower with the CHG soap starting 4 days before surgery using the following schedule:     Please keep in mind the following:  DO NOT shave, including legs and underarms, starting the day of your first shower.   You may shave your face at any point before/day of surgery.  Place clean sheets on your bed the day you start using CHG soap. Use a clean washcloth (not used since being washed) for each shower. DO NOT sleep with pets once you start using the CHG.   CHG Shower Instructions:  If you choose to wash your hair and private area, wash first with your normal shampoo/soap.  After you use shampoo/soap, rinse your hair and body thoroughly to remove shampoo/soap residue.  Turn the water OFF and apply about 3 tablespoons (45 ml) of CHG soap to a CLEAN washcloth.  Apply CHG soap ONLY FROM YOUR NECK DOWN TO YOUR TOES (washing for 3-5 minutes)  DO NOT use CHG soap on face, private areas, open wounds, or sores.  Pay special attention to the area where your surgery is being performed.  If you are having back surgery, having someone wash your back  for you may be helpful. Wait 2 minutes after CHG soap is applied, then you may rinse off the CHG soap.  Pat dry with a clean towel  Put on clean clothes/pajamas   If you choose to wear lotion, please use ONLY the CHG-compatible lotions on the back of this paper.     Additional instructions for the day of surgery: DO NOT APPLY any lotions, deodorants, cologne, or perfumes.   Put on clean/comfortable clothes.  Brush your teeth.  Ask your nurse before applying any prescription medications to the skin.      CHG  Compatible Lotions   Aveeno Moisturizing lotion  Cetaphil Moisturizing Cream  Cetaphil Moisturizing Lotion  Clairol Herbal Essence Moisturizing Lotion, Dry Skin  Clairol Herbal Essence Moisturizing Lotion, Extra Dry Skin  Clairol Herbal Essence Moisturizing Lotion, Normal Skin  Curel Age Defying Therapeutic Moisturizing Lotion with Alpha Hydroxy  Curel Extreme Care Body Lotion  Curel Soothing Hands Moisturizing Hand Lotion  Curel Therapeutic Moisturizing Cream, Fragrance-Free  Curel Therapeutic Moisturizing Lotion, Fragrance-Free  Curel Therapeutic Moisturizing Lotion, Original Formula  Eucerin Daily Replenishing Lotion  Eucerin Dry Skin Therapy Plus Alpha Hydroxy Crme  Eucerin Dry Skin Therapy Plus Alpha Hydroxy Lotion  Eucerin Original Crme  Eucerin Original Lotion  Eucerin Plus Crme Eucerin Plus Lotion  Eucerin TriLipid Replenishing Lotion  Keri Anti-Bacterial Hand Lotion  Keri Deep Conditioning Original Lotion Dry Skin Formula Softly Scented  Keri Deep Conditioning Original Lotion, Fragrance Free Sensitive Skin Formula  Keri Lotion Fast Absorbing Fragrance Free Sensitive Skin Formula  Keri Lotion Fast Absorbing Softly Scented Dry Skin Formula  Keri Original Lotion  Keri Skin Renewal Lotion Keri Silky Smooth Lotion  Keri Silky Smooth Sensitive Skin Lotion  Nivea Body Creamy Conditioning Oil  Nivea Body Extra Enriched Lotion  Nivea Body Original Lotion  Nivea Body Sheer Moisturizing Lotion Nivea Crme  Nivea Skin Firming Lotion  NutraDerm 30 Skin Lotion  NutraDerm Skin Lotion  NutraDerm Therapeutic Skin Cream  NutraDerm Therapeutic Skin Lotion  ProShield Protective Hand Cream   Incentive Spirometer   An incentive spirometer is a tool that can help keep your lungs clear and active. This tool measures how well you are filling your lungs with each breath. Taking long deep breaths may help reverse or decrease the chance of developing breathing (pulmonary) problems  (especially infection) following: A long period of time when you are unable to move or be active. BEFORE THE PROCEDURE  If the spirometer includes an indicator to show your best effort, your nurse or respiratory therapist will set it to a desired goal. If possible, sit up straight or lean slightly forward. Try not to slouch. Hold the incentive spirometer in an upright position. INSTRUCTIONS FOR USE  Sit on the edge of your bed if possible, or sit up as far as you can in bed or on a chair. Hold the incentive spirometer in an upright position. Breathe out normally. Place the mouthpiece in your mouth and seal your lips tightly around it. Breathe in slowly and as deeply as possible, raising the piston or the ball toward the top of the column. Hold your breath for 3-5 seconds or for as long as possible. Allow the piston or ball to fall to the bottom of the column. Remove the mouthpiece from your mouth and breathe out normally. Rest for a few seconds and repeat Steps 1 through 7 at least 10 times every 1-2 hours when you are awake. Take your time and take a few normal  breaths between deep breaths. The spirometer may include an indicator to show your best effort. Use the indicator as a goal to work toward during each repetition. After each set of 10 deep breaths, practice coughing to be sure your lungs are clear. If you have an incision (the cut made at the time of surgery), support your incision when coughing by placing a pillow or rolled up towels firmly against it. Once you are able to get out of bed, walk around indoors and cough well. You may stop using the incentive spirometer when instructed by your caregiver.  RISKS AND COMPLICATIONS Take your time so you do not get dizzy or light-headed. If you are in pain, you may need to take or ask for pain medication before doing incentive spirometry. It is harder to take a deep breath if you are having pain. AFTER USE Rest and breathe slowly and  easily. It can be helpful to keep track of a log of your progress. Your caregiver can provide you with a simple table to help with this. If you are using the spirometer at home, follow these instructions: SEEK MEDICAL CARE IF:  You are having difficultly using the spirometer. You have trouble using the spirometer as often as instructed. Your pain medication is not giving enough relief while using the spirometer. You develop fever of 100.5 F (38.1 C) or higher. SEEK IMMEDIATE MEDICAL CARE IF:  You cough up bloody sputum that had not been present before. You develop fever of 102 F (38.9 C) or greater. You develop worsening pain at or near the incision site. MAKE SURE YOU:  Understand these instructions. Will watch your condition. Will get help right away if you are not doing well or get worse. Document Released: 11/04/2006 Document Revised: 09/16/2011 Document Reviewed: 01/05/2007   WHAT IS A BLOOD TRANSFUSION? Blood Transfusion Information  A transfusion is the replacement of blood or some of its parts. Blood is made up of multiple cells which provide different functions. Red blood cells carry oxygen and are used for blood loss replacement. White blood cells fight against infection. Platelets control bleeding. Plasma helps clot blood. Other blood products are available for specialized needs, such as hemophilia or other clotting disorders. BEFORE THE TRANSFUSION  Who gives blood for transfusions?  Healthy volunteers who are fully evaluated to make sure their blood is safe. This is blood bank blood. Transfusion therapy is the safest it has ever been in the practice of medicine. Before blood is taken from a donor, a complete history is taken to make sure that person has no history of diseases nor engages in risky social behavior (examples are intravenous drug use or sexual activity with multiple partners). The donor's travel history is screened to minimize risk of transmitting  infections, such as malaria. The donated blood is tested for signs of infectious diseases, such as HIV and hepatitis. The blood is then tested to be sure it is compatible with you in order to minimize the chance of a transfusion reaction. If you or a relative donates blood, this is often done in anticipation of surgery and is not appropriate for emergency situations. It takes many days to process the donated blood. RISKS AND COMPLICATIONS Although transfusion therapy is very safe and saves many lives, the main dangers of transfusion include:  Getting an infectious disease. Developing a transfusion reaction. This is an allergic reaction to something in the blood you were given. Every precaution is taken to prevent this. The decision to have a  blood transfusion has been considered carefully by your caregiver before blood is given. Blood is not given unless the benefits outweigh the risks. AFTER THE TRANSFUSION Right after receiving a blood transfusion, you will usually feel much better and more energetic. This is especially true if your red blood cells have gotten low (anemic). The transfusion raises the level of the red blood cells which carry oxygen, and this usually causes an energy increase. The nurse administering the transfusion will monitor you carefully for complications. HOME CARE INSTRUCTIONS  No special instructions are needed after a transfusion. You may find your energy is better. Speak with your caregiver about any limitations on activity for underlying diseases you may have. SEEK MEDICAL CARE IF:  Your condition is not improving after your transfusion. You develop redness or irritation at the intravenous (IV) site. SEEK IMMEDIATE MEDICAL CARE IF:  Any of the following symptoms occur over the next 12 hours: Shaking chills. You have a temperature by mouth above 102 F (38.9 C), not controlled by medicine. Chest, back, or muscle pain. People around you feel you are not acting correctly  or are confused. Shortness of breath or difficulty breathing. Dizziness and fainting. You get a rash or develop hives. You have a decrease in urine output. Your urine turns a dark color or changes to pink, red, or brown. Any of the following symptoms occur over the next 10 days: You have a temperature by mouth above 102 F (38.9 C), not controlled by medicine. Shortness of breath. Weakness after normal activity. The white part of the eye turns yellow (jaundice). You have a decrease in the amount of urine or are urinating less often. Your urine turns a dark color or changes to pink, red, or brown. Document Released: 06/21/2000 Document Revised: 09/16/2011 Document Reviewed: 02/08/2008 [facility] Patient Information 2014 [place], [place].

## 2023-12-19 NOTE — Progress Notes (Signed)
 COVID Vaccine received:  [x]  No []  Yes Date of any COVID positive Test in last 90 days: no PCP - Abraham Hoffmann Early NP Cardiologist - n/a  Chest x-ray -  EKG -   Stress Test -  ECHO -  Cardiac Cath -   Bowel Prep - [x]  No  []   Yes ______  Pacemaker / ICD device [x]  No []  Yes   Spinal Cord Stimulator:[x]  No []  Yes       History of Sleep Apnea? [x]  No []  Yes   CPAP used?- [x]  No []  Yes    Does the patient monitor blood sugar?          [x]  No []  Yes  []  N/A  Patient has: [x]  NO Hx DM   []  Pre-DM                 []  DM1  []   DM2 Does patient have a [employer] or Dexacom? []  No []  Yes   Fasting Blood Sugar Ranges-  Checks Blood Sugar _____ times a day  GLP1 agonist / usual dose - no GLP1 instructions:  SGLT-2 inhibitors / usual dose - no SGLT-2 instructions:   Blood Thinner / Instructions:no Aspirin Instructions:no  Comments:   Activity level: Patient is able  to climb a flight of stairs without difficulty; [x]  No CP  []  No SOB,    Patient can perform ADLs without assistance.   Anesthesia review:   Patient denies shortness of breath, fever, cough and chest pain at PAT appointment.  Patient verbalized understanding and agreement to the Pre-Surgical Instructions that were given to them at this PAT appointment. Patient was also educated of the need to review these PAT instructions again prior to his/her surgery.I reviewed the appropriate phone numbers to call if they have any and questions or concerns.

## 2023-12-22 ENCOUNTER — Encounter (HOSPITAL_COMMUNITY): Payer: Self-pay

## 2023-12-22 ENCOUNTER — Telehealth: Payer: Self-pay | Admitting: Orthopaedic Surgery

## 2023-12-22 ENCOUNTER — Other Ambulatory Visit: Payer: Self-pay

## 2023-12-22 ENCOUNTER — Encounter (HOSPITAL_COMMUNITY)
Admission: RE | Admit: 2023-12-22 | Discharge: 2023-12-22 | Disposition: A | Discharge status: Home / Self Care | Source: Ambulatory Visit | Attending: Orthopaedic Surgery | Admitting: Orthopaedic Surgery

## 2023-12-22 VITALS — BP 115/77 | HR 80 | Temp 98.0°F | Resp 16 | Ht 74.0 in | Wt 196.0 lb

## 2023-12-22 DIAGNOSIS — M1611 Unilateral primary osteoarthritis, right hip: Secondary | ICD-10-CM | POA: Diagnosis not present

## 2023-12-22 DIAGNOSIS — Z01812 Encounter for preprocedural laboratory examination: Secondary | ICD-10-CM | POA: Diagnosis not present

## 2023-12-22 DIAGNOSIS — Z01818 Encounter for other preprocedural examination: Secondary | ICD-10-CM | POA: Diagnosis present

## 2023-12-22 HISTORY — DX: Unspecified osteoarthritis, unspecified site: M19.90

## 2023-12-22 HISTORY — DX: Prediabetes: R73.03

## 2023-12-22 LAB — SURGICAL PCR SCREEN
MRSA, PCR: NEGATIVE
Staphylococcus aureus: NEGATIVE

## 2023-12-22 LAB — CBC
HCT: 40.2 % (ref 39.0–52.0)
Hemoglobin: 13.3 g/dL (ref 13.0–17.0)
MCH: 30.2 pg (ref 26.0–34.0)
MCHC: 33.1 g/dL (ref 30.0–36.0)
MCV: 91.4 fL (ref 80.0–100.0)
Platelets: 274 10*3/uL (ref 150–400)
RBC: 4.4 MIL/uL (ref 4.22–5.81)
RDW: 13.5 % (ref 11.5–15.5)
WBC: 4.2 10*3/uL (ref 4.0–10.5)
nRBC: 0 % (ref 0.0–0.2)

## 2023-12-22 LAB — BASIC METABOLIC PANEL WITH GFR
Anion gap: 10 (ref 5–15)
BUN: 15 mg/dL (ref 8–23)
CO2: 25 mmol/L (ref 22–32)
Calcium: 9.3 mg/dL (ref 8.9–10.3)
Chloride: 96 mmol/L — ABNORMAL LOW (ref 98–111)
Creatinine, Ser: 0.84 mg/dL (ref 0.61–1.24)
GFR, Estimated: >60 mL/min (ref >60)
Glucose, Bld: 106 mg/dL — ABNORMAL HIGH (ref 70–99)
Potassium: 4.8 mmol/L (ref 3.5–5.1)
Sodium: 131 mmol/L — ABNORMAL LOW (ref 135–145)

## 2023-12-22 LAB — TYPE AND SCREEN
ABO/RH(D): A POS
Antibody Screen: NEGATIVE

## 2023-12-22 NOTE — Telephone Encounter (Signed)
 Received

## 2023-12-22 NOTE — Telephone Encounter (Signed)
 Pt submitted medical release form, [employer] forms, and $20.00 payment. Accepted 12/22/23

## 2024-01-01 NOTE — Anesthesia Preprocedure Evaluation (Addendum)
 Anesthesia Evaluation  Patient identified by MRN, date of birth, ID band Patient awake    Reviewed: Allergy & Precautions, NPO status , Patient's Chart, lab work & pertinent test results  History of Anesthesia Complications Negative for: history of anesthetic complications  Airway Mallampati: II  TM Distance: >3 FB Neck ROM: Full    Dental  (+) Dental Advisory Given   Pulmonary neg shortness of breath, asthma (used inhaler last week) , neg sleep apnea, neg COPD, neg recent URI, former smoker   Pulmonary exam normal breath sounds clear to auscultation       Cardiovascular (-) hypertension(-) angina + DVT  (-) Past MI, (-) Cardiac Stents and (-) CABG (-) dysrhythmias  Rhythm:Regular Rate:Normal     Neuro/Psych neg Seizures negative neurological ROS     GI/Hepatic Neg liver ROS,GERD  ,,H/o esophageal cancer s/p esophagectomy, h/o diverticulitis   Endo/Other  Pre-diabetes  Renal/GU negative Renal ROS     Musculoskeletal  (+) Arthritis , Osteoarthritis,    Abdominal   Peds  Hematology  (+) Blood dyscrasia Lab Results      Component                Value               Date                      WBC                      4.2                 12/22/2023                HGB                      13.3                12/22/2023                HCT                      40.2                12/22/2023                MCV                      91.4                12/22/2023                PLT                      274                 12/22/2023              Anesthesia Other Findings   Reproductive/Obstetrics                             Anesthesia Physical Anesthesia Plan  ASA: 3  Anesthesia Plan: General   Post-op Pain Management: Tylenol PO (pre-op)*, Toradol IV (intra-op)* and Dilaudid IV   Induction: Intravenous and Rapid sequence  PONV Risk Score and Plan: 3 and Ondansetron , Dexamethasone , Propofol  infusion, Midazolam and Treatment may vary due to age  or medical condition  Airway Management Planned: Oral ETT  Additional Equipment:   Intra-op Plan:   Post-operative Plan: Extubation in OR  Informed Consent: I have reviewed the patients History and Physical, chart, labs and discussed the procedure including the risks, benefits and alternatives for the proposed anesthesia with the patient or authorized representative who has indicated his/her understanding and acceptance.     Dental advisory given  Plan Discussed with: CRNA and Anesthesiologist  Anesthesia Plan Comments: (Patient reports issues with stuff coming up from his stomach when he is lying flat due to his h/o esophagectomy. He sleeps propped up on a wedge at night. Due to this, plan for RSI and GETA.  Risks of general anesthesia discussed including, but not limited to, sore throat, hoarse voice, chipped/damaged teeth, injury to vocal cords, nausea and vomiting, allergic reactions, lung infection, heart attack, stroke, and death. All questions answered.  )        Anesthesia Quick Evaluation

## 2024-01-01 NOTE — H&P (Signed)
 TOTAL HIP ADMISSION H&P  Patient is admitted for right total hip arthroplasty.  Subjective:  Chief Complaint: right hip pain  HPI: Alfred Delgado, 63 y.o. male, has a history of pain and functional disability in the right hip(s) due to arthritis and patient has failed non-surgical conservative treatments for greater than 12 weeks to include NSAID's and/or analgesics, corticosteriod injections, and activity modification.  Onset of symptoms was gradual starting a few years ago with gradually worsening course since that time.The patient noted no past surgery on the right hip(s).  Patient currently rates pain in the right hip at 10 out of 10 with activity. Patient has night pain, worsening of pain with activity and weight bearing, pain that interfers with activities of daily living, and pain with passive range of motion. Patient has evidence of subchondral sclerosis, periarticular osteophytes, and joint space narrowing by imaging studies. This condition presents safety issues increasing the risk of falls.  There is no current active infection.  Patient Active Problem List   Diagnosis Date Noted   Unilateral primary osteoarthritis, right hip 11/19/2023   Chronic right hip pain 02/20/2023   Uncomplicated varicose veins 12/23/2022   Piriformis syndrome of right side 12/23/2022   Mood changes 12/23/2022   Hx of iron deficiency anemia 05/23/2021   Positive depression screening 05/23/2021   H/O therapeutic radiation 02/24/2018   History of chemotherapy 02/24/2018   Encounter for annual physical exam 02/16/2015   History of esophageal cancer 08/30/2010   GERD 07/23/2010   UNSPECIFIED HYPOTENSION 06/25/2010   Allergic rhinitis 06/25/2010   Asthma 06/25/2010   History of colonic polyps 06/25/2010   DIVERTICULITIS, HX OF 06/25/2010   Past Medical History:  Diagnosis Date   Allergy    Anemia    Arthritis    Asthma    BELCHING 07/23/2010   Qualifier: Diagnosis of  By: Inocencio MD, [place] LABOR     CHEST PAIN 06/25/2010   Qualifier: Diagnosis of  By: Inocencio MD, [place] A    DVT of lower extremity (deep venous thrombosis) (HCC)    History of colon polyps    History of colonic diverticulitis    LUQ PAIN 06/25/2010   Qualifier: Diagnosis of  By: Inocencio MD, [place] A    Multiple lipomas    History of   NEOPLASM, MALIGNANT, ESOPHAGUS 08/30/2010   Pre-diabetes     Past Surgical History:  Procedure Laterality Date   COLONOSCOPY  last 2013   ESOPHAGOGASTRECTOMY  2012   Dr. Dickey [place]   POLYPECTOMY     PORTA CATH REMOVAL     PORTACATH PLACEMENT  03/14/2011   TONSILLECTOMY     UPPER GASTROINTESTINAL ENDOSCOPY      No current facility-administered medications for this encounter.   Current Outpatient Medications  Medication Sig Dispense Refill Last Dose/Taking   albuterol  (VENTOLIN  HFA) 108 (90 Base) MCG/ACT inhaler Inhale 2 puffs into the lungs every 6 (six) hours as needed for wheezing or shortness of breath. 8 g 0 Taking As Needed   naproxen sodium (ALEVE) 220 MG tablet Take 220-440 mg by mouth 2 (two) times daily.   Taking   No Known Allergies  Social History   Tobacco Use   Smoking status: Former    Current packs/day: 0.00    Average packs/day: 1.5 packs/day for 20.0 years (30.0 ttl pk-yrs)    Types: Cigarettes    Start date: 07/08/1977    Quit date: 07/08/1997    Years since quitting: 26.5   Smokeless tobacco: Never  Substance Use Topics   Alcohol use: No    Alcohol/week: 0.0 standard drinks of alcohol    Family History  Problem Relation Age of Onset   Breast cancer Mother    Thyroid  disease Mother    Throat cancer Father    Colon polyps Father    Esophageal cancer Father    Thyroid  disease Maternal Grandmother    Cancer Paternal Grandmother    Heart disease Paternal Grandfather    Colon cancer Neg Hx    Rectal cancer Neg Hx    Stomach cancer Neg Hx      Review of Systems  Objective:  Physical Exam Vitals reviewed.  Constitutional:      Appearance:  Normal appearance. He is normal weight.  HENT:     Head: Normocephalic and atraumatic.   Eyes:     Extraocular Movements: Extraocular movements intact.     Pupils: Pupils are equal, round, and reactive to light.    Cardiovascular:     Rate and Rhythm: Normal rate and regular rhythm.     Pulses: Normal pulses.  Pulmonary:     Effort: Pulmonary effort is normal.     Breath sounds: Normal breath sounds.  Abdominal:     Palpations: Abdomen is soft.   Musculoskeletal:     Cervical back: Normal range of motion and neck supple.     Right hip: Tenderness and bony tenderness present. Decreased range of motion. Decreased strength.   Neurological:     Mental Status: He is alert and oriented to person, place, and time.   Psychiatric:        Behavior: Behavior normal.     Vital signs in last 24 hours:    Labs:   Estimated body mass index is 25.16 kg/m as calculated from the following:   Height as of 12/22/23: 6' 2 (1.88 m).   Weight as of 12/22/23: 88.9 kg.   Imaging Review Plain radiographs demonstrate severe degenerative joint disease of the right hip(s). The bone quality appears to be excellent for age and reported activity level.      Assessment/Plan:  End stage arthritis, right hip(s)  The patient history, physical examination, clinical judgement of the provider and imaging studies are consistent with end stage degenerative joint disease of the right hip(s) and total hip arthroplasty is deemed medically necessary. The treatment options including medical management, injection therapy, arthroscopy and arthroplasty were discussed at length. The risks and benefits of total hip arthroplasty were presented and reviewed. The risks due to aseptic loosening, infection, stiffness, dislocation/subluxation,  thromboembolic complications and other imponderables were discussed.  The patient acknowledged the explanation, agreed to proceed with the plan and consent was signed. Patient is  being admitted for inpatient treatment for surgery, pain control, PT, OT, prophylactic antibiotics, VTE prophylaxis, progressive ambulation and ADL's and discharge planning.The patient is planning to be discharged home with home health services

## 2024-01-02 ENCOUNTER — Other Ambulatory Visit: Payer: Self-pay

## 2024-01-02 ENCOUNTER — Encounter (HOSPITAL_COMMUNITY)
Admission: RE | Disposition: A | Discharge status: Home / Self Care | Payer: Self-pay | Source: Ambulatory Visit | Attending: Orthopaedic Surgery

## 2024-01-02 ENCOUNTER — Observation Stay (HOSPITAL_COMMUNITY)

## 2024-01-02 ENCOUNTER — Ambulatory Visit (HOSPITAL_COMMUNITY): Payer: Self-pay | Admitting: Anesthesiology

## 2024-01-02 ENCOUNTER — Observation Stay (HOSPITAL_COMMUNITY)
Admission: RE | Admit: 2024-01-02 | Discharge: 2024-01-03 | Disposition: A | Discharge status: Home / Self Care | Source: Ambulatory Visit | Attending: Orthopaedic Surgery | Admitting: Orthopaedic Surgery

## 2024-01-02 ENCOUNTER — Ambulatory Visit (HOSPITAL_BASED_OUTPATIENT_CLINIC_OR_DEPARTMENT_OTHER): Payer: Self-pay | Admitting: Anesthesiology

## 2024-01-02 ENCOUNTER — Ambulatory Visit (HOSPITAL_COMMUNITY)

## 2024-01-02 ENCOUNTER — Encounter (HOSPITAL_COMMUNITY): Payer: Self-pay | Admitting: Orthopaedic Surgery

## 2024-01-02 DIAGNOSIS — Z79899 Other long term (current) drug therapy: Secondary | ICD-10-CM | POA: Diagnosis not present

## 2024-01-02 DIAGNOSIS — M1611 Unilateral primary osteoarthritis, right hip: Principal | ICD-10-CM | POA: Diagnosis present

## 2024-01-02 DIAGNOSIS — Z96641 Presence of right artificial hip joint: Secondary | ICD-10-CM

## 2024-01-02 DIAGNOSIS — J45909 Unspecified asthma, uncomplicated: Secondary | ICD-10-CM | POA: Insufficient documentation

## 2024-01-02 DIAGNOSIS — Z8719 Personal history of other diseases of the digestive system: Secondary | ICD-10-CM | POA: Diagnosis not present

## 2024-01-02 DIAGNOSIS — Z86718 Personal history of other venous thrombosis and embolism: Secondary | ICD-10-CM | POA: Insufficient documentation

## 2024-01-02 DIAGNOSIS — Z87891 Personal history of nicotine dependence: Secondary | ICD-10-CM | POA: Insufficient documentation

## 2024-01-02 HISTORY — PX: TOTAL HIP ARTHROPLASTY: SHX124

## 2024-01-02 LAB — ABO/RH: ABO/RH(D): A POS

## 2024-01-02 SURGERY — ARTHROPLASTY, HIP, TOTAL, ANTERIOR APPROACH
Anesthesia: Monitor Anesthesia Care | Site: Hip | Laterality: Right

## 2024-01-02 MED ORDER — HYDROMORPHONE HCL 1 MG/ML IJ SOLN
0.2500 mg | INTRAMUSCULAR | Status: DC | PRN
  Administered 2024-01-02 (×4): 0.5 mg via INTRAVENOUS

## 2024-01-02 MED ORDER — PROPOFOL 10 MG/ML IV BOLUS
INTRAVENOUS | Status: DC | PRN
  Administered 2024-01-02: 40 ug/kg/min via INTRAVENOUS
  Administered 2024-01-02: 200 mg via INTRAVENOUS

## 2024-01-02 MED ORDER — HYDROMORPHONE HCL 1 MG/ML IJ SOLN
INTRAMUSCULAR | Status: AC
Start: 2024-01-02 — End: 2024-01-02
  Filled 2024-01-02: qty 1

## 2024-01-02 MED ORDER — PROPOFOL 10 MG/ML IV BOLUS
INTRAVENOUS | Status: AC
  Filled 2024-01-02: qty 20

## 2024-01-02 MED ORDER — STERILE WATER FOR IRRIGATION IR SOLN
Status: DC | PRN
  Administered 2024-01-02: 2000 mL

## 2024-01-02 MED ORDER — FENTANYL CITRATE (PF) 100 MCG/2ML IJ SOLN
INTRAMUSCULAR | Status: AC
  Filled 2024-01-02: qty 2

## 2024-01-02 MED ORDER — OXYCODONE HCL 5 MG PO TABS: 5.0000 mg | ORAL_TABLET | Freq: Once | ORAL | Status: DC | PRN

## 2024-01-02 MED ORDER — ACETAMINOPHEN 500 MG PO TABS
1000.0000 mg | ORAL_TABLET | Freq: Once | ORAL | Status: AC
  Administered 2024-01-02: 1000 mg via ORAL
  Filled 2024-01-02: qty 2

## 2024-01-02 MED ORDER — KETAMINE HCL 50 MG/5ML IJ SOSY
PREFILLED_SYRINGE | INTRAMUSCULAR | Status: AC
  Filled 2024-01-02: qty 5

## 2024-01-02 MED ORDER — POVIDONE-IODINE 10 % EX SWAB: 2.0000 | Freq: Once | CUTANEOUS | Status: DC

## 2024-01-02 MED ORDER — SUCCINYLCHOLINE CHLORIDE 200 MG/10ML IV SOSY
PREFILLED_SYRINGE | INTRAVENOUS | Status: AC
  Filled 2024-01-02: qty 10

## 2024-01-02 MED ORDER — DEXMEDETOMIDINE HCL IN NACL 80 MCG/20ML IV SOLN
INTRAVENOUS | Status: AC
  Filled 2024-01-02: qty 20

## 2024-01-02 MED ORDER — ONDANSETRON HCL 4 MG/2ML IJ SOLN
INTRAMUSCULAR | Status: DC | PRN
  Administered 2024-01-02: 4 mg via INTRAVENOUS

## 2024-01-02 MED ORDER — LACTATED RINGERS IV SOLN: INTRAVENOUS | Status: DC

## 2024-01-02 MED ORDER — METOCLOPRAMIDE HCL 5 MG PO TABS: 5.0000 mg | ORAL_TABLET | Freq: Three times a day (TID) | ORAL | Status: DC | PRN

## 2024-01-02 MED ORDER — HYDROMORPHONE HCL 1 MG/ML IJ SOLN: 0.5000 mg | INTRAMUSCULAR | Status: DC | PRN

## 2024-01-02 MED ORDER — ROCURONIUM BROMIDE 10 MG/ML (PF) SYRINGE
PREFILLED_SYRINGE | INTRAVENOUS | Status: AC
  Filled 2024-01-02: qty 10

## 2024-01-02 MED ORDER — KETAMINE HCL 50 MG/5ML IJ SOSY
PREFILLED_SYRINGE | INTRAMUSCULAR | Status: DC | PRN
Start: 2024-01-02 — End: 2024-01-02
  Administered 2024-01-02 (×2): 20 mg via INTRAVENOUS

## 2024-01-02 MED ORDER — HYDROMORPHONE HCL 1 MG/ML IJ SOLN
INTRAMUSCULAR | Status: AC
  Filled 2024-01-02: qty 1

## 2024-01-02 MED ORDER — ALBUTEROL SULFATE (2.5 MG/3ML) 0.083% IN NEBU: 2.5000 mg | INHALATION_SOLUTION | Freq: Four times a day (QID) | RESPIRATORY_TRACT | Status: DC | PRN

## 2024-01-02 MED ORDER — METOCLOPRAMIDE HCL 5 MG/ML IJ SOLN: 5.0000 mg | Freq: Three times a day (TID) | INTRAMUSCULAR | Status: DC | PRN

## 2024-01-02 MED ORDER — 0.9 % SODIUM CHLORIDE (POUR BTL) OPTIME
TOPICAL | Status: DC | PRN
  Administered 2024-01-02: 1000 mL

## 2024-01-02 MED ORDER — DEXAMETHASONE SODIUM PHOSPHATE 10 MG/ML IJ SOLN
INTRAMUSCULAR | Status: AC
  Filled 2024-01-02: qty 1

## 2024-01-02 MED ORDER — METHOCARBAMOL 1000 MG/10ML IJ SOLN
INTRAMUSCULAR | Status: AC
  Filled 2024-01-02: qty 10

## 2024-01-02 MED ORDER — ONDANSETRON HCL 4 MG PO TABS: 4.0000 mg | ORAL_TABLET | Freq: Four times a day (QID) | ORAL | Status: DC | PRN

## 2024-01-02 MED ORDER — CEFAZOLIN SODIUM-DEXTROSE 2-4 GM/100ML-% IV SOLN
2.0000 g | INTRAVENOUS | Status: AC
  Administered 2024-01-02: 2 g via INTRAVENOUS
  Filled 2024-01-02: qty 100

## 2024-01-02 MED ORDER — ASPIRIN 81 MG PO CHEW
81.0000 mg | CHEWABLE_TABLET | Freq: Two times a day (BID) | ORAL | Status: DC
Start: 2024-01-02 — End: 2024-01-03
  Administered 2024-01-02 – 2024-01-03 (×2): 81 mg via ORAL
  Filled 2024-01-02 (×2): qty 1

## 2024-01-02 MED ORDER — SODIUM CHLORIDE 0.9 % IR SOLN
Status: DC | PRN
  Administered 2024-01-02: 1000 mL

## 2024-01-02 MED ORDER — OXYCODONE HCL 5 MG PO TABS: 10.0000 mg | ORAL_TABLET | ORAL | Status: DC | PRN

## 2024-01-02 MED ORDER — MENTHOL 3 MG MT LOZG: 1.0000 | LOZENGE | OROMUCOSAL | Status: DC | PRN

## 2024-01-02 MED ORDER — SODIUM CHLORIDE 0.9 % IV SOLN: INTRAVENOUS | Status: DC

## 2024-01-02 MED ORDER — METHOCARBAMOL 500 MG PO TABS
500.0000 mg | ORAL_TABLET | Freq: Four times a day (QID) | ORAL | Status: DC | PRN
  Administered 2024-01-03: 500 mg via ORAL
  Filled 2024-01-02: qty 1

## 2024-01-02 MED ORDER — EPHEDRINE SULFATE-NACL 50-0.9 MG/10ML-% IV SOSY
PREFILLED_SYRINGE | INTRAVENOUS | Status: DC | PRN
  Administered 2024-01-02: 5 mg via INTRAVENOUS

## 2024-01-02 MED ORDER — ONDANSETRON HCL 4 MG/2ML IJ SOLN
INTRAMUSCULAR | Status: AC
  Filled 2024-01-02: qty 2

## 2024-01-02 MED ORDER — SUGAMMADEX SODIUM 200 MG/2ML IV SOLN
INTRAVENOUS | Status: DC | PRN
  Administered 2024-01-02: 200 mg via INTRAVENOUS

## 2024-01-02 MED ORDER — PANTOPRAZOLE SODIUM 40 MG PO TBEC
40.0000 mg | DELAYED_RELEASE_TABLET | Freq: Every day | ORAL | Status: DC
  Administered 2024-01-02 – 2024-01-03 (×2): 40 mg via ORAL
  Filled 2024-01-02 (×2): qty 1

## 2024-01-02 MED ORDER — DEXAMETHASONE SODIUM PHOSPHATE 10 MG/ML IJ SOLN
INTRAMUSCULAR | Status: DC | PRN
Start: 2024-01-02 — End: 2024-01-02
  Administered 2024-01-02: 8 mg via INTRAVENOUS

## 2024-01-02 MED ORDER — SUCCINYLCHOLINE CHLORIDE 200 MG/10ML IV SOSY
PREFILLED_SYRINGE | INTRAVENOUS | Status: DC | PRN
  Administered 2024-01-02: 100 mg via INTRAVENOUS

## 2024-01-02 MED ORDER — ALBUTEROL SULFATE HFA 108 (90 BASE) MCG/ACT IN AERS: 2.0000 | INHALATION_SPRAY | Freq: Four times a day (QID) | RESPIRATORY_TRACT | Status: DC | PRN

## 2024-01-02 MED ORDER — TRANEXAMIC ACID-NACL 1000-0.7 MG/100ML-% IV SOLN
1000.0000 mg | INTRAVENOUS | Status: AC
  Administered 2024-01-02: 1000 mg via INTRAVENOUS
  Filled 2024-01-02: qty 100

## 2024-01-02 MED ORDER — OXYCODONE HCL 5 MG/5ML PO SOLN: 5.0000 mg | Freq: Once | ORAL | Status: DC | PRN

## 2024-01-02 MED ORDER — PHENYLEPHRINE 80 MCG/ML (10ML) SYRINGE FOR IV PUSH (FOR BLOOD PRESSURE SUPPORT)
PREFILLED_SYRINGE | INTRAVENOUS | Status: DC | PRN
  Administered 2024-01-02 (×3): 80 ug via INTRAVENOUS

## 2024-01-02 MED ORDER — PHENYLEPHRINE HCL-NACL 20-0.9 MG/250ML-% IV SOLN
INTRAVENOUS | Status: DC | PRN
  Administered 2024-01-02: 20 ug/min via INTRAVENOUS

## 2024-01-02 MED ORDER — ONDANSETRON HCL 4 MG/2ML IJ SOLN: 4.0000 mg | Freq: Four times a day (QID) | INTRAMUSCULAR | Status: DC | PRN

## 2024-01-02 MED ORDER — LIDOCAINE HCL (PF) 2 % IJ SOLN
INTRAMUSCULAR | Status: AC
  Filled 2024-01-02: qty 5

## 2024-01-02 MED ORDER — PROPOFOL 1000 MG/100ML IV EMUL
INTRAVENOUS | Status: AC
  Filled 2024-01-02: qty 100

## 2024-01-02 MED ORDER — FENTANYL CITRATE (PF) 100 MCG/2ML IJ SOLN
INTRAMUSCULAR | Status: DC | PRN
  Administered 2024-01-02: 100 ug via INTRAVENOUS
  Administered 2024-01-02: 50 ug via INTRAVENOUS

## 2024-01-02 MED ORDER — OXYCODONE HCL 5 MG PO TABS
5.0000 mg | ORAL_TABLET | ORAL | Status: DC | PRN
  Administered 2024-01-03: 5 mg via ORAL
  Filled 2024-01-02: qty 1

## 2024-01-02 MED ORDER — AMISULPRIDE (ANTIEMETIC) 5 MG/2ML IV SOLN: 10.0000 mg | Freq: Once | INTRAVENOUS | Status: DC | PRN

## 2024-01-02 MED ORDER — PHENOL 1.4 % MT LIQD: 1.0000 | OROMUCOSAL | Status: DC | PRN

## 2024-01-02 MED ORDER — MIDAZOLAM HCL 2 MG/2ML IJ SOLN
INTRAMUSCULAR | Status: AC
  Filled 2024-01-02: qty 2

## 2024-01-02 MED ORDER — ACETAMINOPHEN 325 MG PO TABS
325.0000 mg | ORAL_TABLET | Freq: Four times a day (QID) | ORAL | Status: DC | PRN
  Administered 2024-01-03: 650 mg via ORAL
  Filled 2024-01-02: qty 2

## 2024-01-02 MED ORDER — DIPHENHYDRAMINE HCL 12.5 MG/5ML PO ELIX: 12.5000 mg | ORAL_SOLUTION | ORAL | Status: DC | PRN

## 2024-01-02 MED ORDER — ALUM & MAG HYDROXIDE-SIMETH 200-200-20 MG/5ML PO SUSP
30.0000 mL | ORAL | Status: DC | PRN
Start: 2024-01-02 — End: 2024-01-03

## 2024-01-02 MED ORDER — MIDAZOLAM HCL 5 MG/5ML IJ SOLN
INTRAMUSCULAR | Status: DC | PRN
  Administered 2024-01-02: 2 mg via INTRAVENOUS

## 2024-01-02 MED ORDER — METHOCARBAMOL 1000 MG/10ML IJ SOLN
500.0000 mg | Freq: Four times a day (QID) | INTRAMUSCULAR | Status: DC | PRN
  Administered 2024-01-02: 500 mg via INTRAVENOUS

## 2024-01-02 MED ORDER — CEFAZOLIN SODIUM-DEXTROSE 2-4 GM/100ML-% IV SOLN
2.0000 g | Freq: Four times a day (QID) | INTRAVENOUS | Status: AC
  Administered 2024-01-02 – 2024-01-03 (×2): 2 g via INTRAVENOUS
  Filled 2024-01-02 (×2): qty 100

## 2024-01-02 MED ORDER — DOCUSATE SODIUM 100 MG PO CAPS
100.0000 mg | ORAL_CAPSULE | Freq: Two times a day (BID) | ORAL | Status: DC
  Administered 2024-01-02 – 2024-01-03 (×2): 100 mg via ORAL
  Filled 2024-01-02 (×2): qty 1

## 2024-01-02 MED ORDER — LIDOCAINE HCL (PF) 2 % IJ SOLN
INTRAMUSCULAR | Status: DC | PRN
Start: 2024-01-02 — End: 2024-01-02
  Administered 2024-01-02: 100 mg via INTRADERMAL

## 2024-01-02 MED ORDER — CHLORHEXIDINE GLUCONATE 0.12 % MT SOLN
15.0000 mL | Freq: Once | OROMUCOSAL | Status: AC
  Administered 2024-01-02: 15 mL via OROMUCOSAL

## 2024-01-02 MED ORDER — ORAL CARE MOUTH RINSE: 15.0000 mL | Freq: Once | OROMUCOSAL | Status: AC

## 2024-01-02 MED ORDER — ROCURONIUM BROMIDE 10 MG/ML (PF) SYRINGE
PREFILLED_SYRINGE | INTRAVENOUS | Status: DC | PRN
Start: 2024-01-02 — End: 2024-01-02
  Administered 2024-01-02: 10 mg via INTRAVENOUS
  Administered 2024-01-02: 50 mg via INTRAVENOUS

## 2024-01-02 SURGICAL SUPPLY — 37 items
BAG COUNTER SPONGE SURGICOUNT (BAG) ×1 IMPLANT
BAG ZIPLOCK 12X15 (MISCELLANEOUS) IMPLANT
BENZOIN TINCTURE PRP APPL 2/3 (GAUZE/BANDAGES/DRESSINGS) IMPLANT
BLADE SAW SGTL 18X1.27X75 (BLADE) ×1 IMPLANT
COVER PERINEAL POST (MISCELLANEOUS) ×1 IMPLANT
COVER SURGICAL LIGHT HANDLE (MISCELLANEOUS) ×1 IMPLANT
CUP GRIPTION SECTOR 62MM (Orthopedic Implant) IMPLANT
DRAPE FOOT SWITCH (DRAPES) ×1 IMPLANT
DRAPE STERI IOBAN 125X83 (DRAPES) ×1 IMPLANT
DRAPE U-SHAPE 47X51 STRL (DRAPES) ×2 IMPLANT
DRSG AQUACEL AG ADV 3.5X10 (GAUZE/BANDAGES/DRESSINGS) ×1 IMPLANT
DURAPREP 26ML APPLICATOR (WOUND CARE) ×1 IMPLANT
ELECT PENCIL ROCKER SW 15FT (MISCELLANEOUS) ×1 IMPLANT
ELECT REM PT RETURN 15FT ADLT (MISCELLANEOUS) ×1 IMPLANT
GAUZE XEROFORM 1X8 LF (GAUZE/BANDAGES/DRESSINGS) IMPLANT
GLOVE BIO SURGEON STRL SZ7.5 (GLOVE) ×1 IMPLANT
GLOVE BIOGEL PI IND STRL 8 (GLOVE) ×2 IMPLANT
GLOVE ECLIPSE 8.0 STRL XLNG CF (GLOVE) ×1 IMPLANT
GOWN STRL REUS W/ TWL XL LVL3 (GOWN DISPOSABLE) ×2 IMPLANT
HEAD CERAMIC DELTA 36 PLUS 1.5 (Hips) IMPLANT
HOLDER FOLEY CATH W/STRAP (MISCELLANEOUS) ×1 IMPLANT
KIT TURNOVER KIT A (KITS) ×1 IMPLANT
LINER NEUTRAL 62MMC36MM P4 (Liner) IMPLANT
PACK ANTERIOR HIP CUSTOM (KITS) ×1 IMPLANT
SCREW 6.5MMX25MM (Screw) IMPLANT
SET HNDPC FAN SPRY TIP SCT (DISPOSABLE) ×1 IMPLANT
STAPLER SKIN PROX 35W (STAPLE) IMPLANT
STEM FEM ACTIS HIGH SZ7 (Stem) IMPLANT
STRIP CLOSURE SKIN 1/2X4 (GAUZE/BANDAGES/DRESSINGS) IMPLANT
SUT ETHIBOND NAB CT1 #1 30IN (SUTURE) ×1 IMPLANT
SUT ETHILON 2 0 PS N (SUTURE) IMPLANT
SUT MNCRL AB 4-0 PS2 18 (SUTURE) IMPLANT
SUT VIC AB 0 CT1 36 (SUTURE) ×1 IMPLANT
SUT VIC AB 1 CT1 36 (SUTURE) ×1 IMPLANT
SUT VIC AB 2-0 CT1 TAPERPNT 27 (SUTURE) ×2 IMPLANT
TRAY FOLEY MTR SLVR 16FR STAT (SET/KITS/TRAYS/PACK) IMPLANT
YANKAUER SUCT BULB TIP NO VENT (SUCTIONS) ×1 IMPLANT

## 2024-01-02 NOTE — Transfer of Care (Signed)
 Immediate Anesthesia Transfer of Care Note  Patient: Alfred Delgado  Procedure(s) Performed: ARTHROPLASTY, HIP, TOTAL, ANTERIOR APPROACH (Right: Hip)  Patient Location: PACU  Anesthesia Type:General  Level of Consciousness: awake and patient cooperative  Airway & Oxygen Therapy: Patient Spontanous Breathing and Patient connected to face mask  Post-op Assessment: Report given to RN and Post -op Vital signs reviewed and stable  Post vital signs: Reviewed and stable  Last Vitals:  Vitals Value Taken Time  BP 129/82 01/02/24 14:26  Temp    Pulse 88 01/02/24 14:27  Resp 12 01/02/24 14:27  SpO2 98 % 01/02/24 14:27  Vitals shown include unfiled device data.  Last Pain:  Vitals:   01/02/24 1137  TempSrc:   PainSc: 0-No pain      Patients Stated Pain Goal: 4 (01/02/24 1137)  Complications: No notable events documented.

## 2024-01-02 NOTE — Interval H&P Note (Signed)
 History and Physical Interval Note: The patient understands that he is here today for a right total hip replacement to treat his significant right hip pain and arthritis.  There has been no acute or interval change in his medical status.  The risks and benefits of surgery have been discussed in detail and informed consent has been obtained.  The right operative hip has been marked.  01/02/2024 11:07 AM  Alfred Delgado  has presented today for surgery, with the diagnosis of Osteoarthritis Right Hip.  The various methods of treatment have been discussed with the patient and family. After consideration of risks, benefits and other options for treatment, the patient has consented to  Procedure(s): ARTHROPLASTY, HIP, TOTAL, ANTERIOR APPROACH (Right) as a surgical intervention.  The patient's history has been reviewed, patient examined, no change in status, stable for surgery.  I have reviewed the patient's chart and labs.  Questions were answered to the patient's satisfaction.     [name]

## 2024-01-02 NOTE — Plan of Care (Signed)
  Problem: Nutrition: Goal: Adequate nutrition will be maintained Outcome: Progressing   Problem: Coping: Goal: Level of anxiety will decrease Outcome: Progressing   Problem: Pain Managment: Goal: General experience of comfort will improve and/or be controlled Outcome: Progressing   Problem: Safety: Goal: Ability to remain free from injury will improve Outcome: Progressing

## 2024-01-02 NOTE — Anesthesia Procedure Notes (Signed)
 Procedure Name: Intubation Date/Time: 01/02/2024 12:40 PM  Performed by: Memory Armida LABOR, CRNAPre-anesthesia Checklist: Patient identified, Emergency Drugs available, Suction available, Patient being monitored and Timeout performed Patient Re-evaluated:Patient Re-evaluated prior to induction Oxygen Delivery Method: Circle system utilized Preoxygenation: Pre-oxygenation with 100% oxygen Induction Type: IV induction, Rapid sequence and Cricoid Pressure applied Laryngoscope Size: Mac and 4 Grade View: Grade I Tube type: Oral Tube size: 7.5 mm Number of attempts: 2 Airway Equipment and Method: Stylet Placement Confirmation: ETT inserted through vocal cords under direct vision, positive ETCO2 and breath sounds checked- equal and bilateral Secured at: 23 cm Tube secured with: Tape Dental Injury: Teeth and Oropharynx as per pre-operative assessment  Comments: RSI with cricoid pressure by Dr Peggye due to pt's history of esophageal CA with LES removal. DL X 1 with MAC 4. Long epiglottis noted. Unable to see vocal cords initially. Repositioned MAC 4 and adjusted cricoid pressure. Grade 1 view. -+ ETCO2. Clear view of vocal cords noted and no stomach contents noted. ATOI.

## 2024-01-02 NOTE — Op Note (Signed)
 Operative Note  Date of operation: 01/02/2024 Preoperative diagnosis: Right hip primary osteoarthritis Postoperative diagnosis: Same  Procedure: Right direct anterior total hip arthroplasty  Implants: Implant Name Type Inv. Item Serial No. Manufacturer Lot No. LRB No. Used Action  CUP LINZIE LEARN - [id number] Orthopedic Implant CUP GRIPTION SECTOR  DEPUY [employer]TH[employer] [id number] Right 1 Implanted  LINER NEUTRAL 62MMC36MM P4 - [id number] Liner LINER NEUTRAL 62MMC36MM P4  DEPUY ORTHOPA[employer]ICS [id number] Right 1 Implanted  SCREW 6.5MMX25MM - [id number] Screw SCREW 6.5MMX25MM  DEPUY ORTHOPA[employer]ICS [id number] Right 1 Implanted  HEAD CERAMIC DELTA 36 PLUS 1.5 - [id number] Hips HEAD CERAMIC DELTA 36 PLUS 1.5  DEPUY ORTH[employer] [id number] Right 1 Implanted  STEM FEM ACTIS HIGH SZ7 - [id number] Stem STEM FEM ACTIS HIGH SZ7  DEPU[employer]TH[employer] [id number] Right 1 Implanted   Surgeon: Lonni GRADE. Vernetta, MD Assistant: Tory Gaskins, PA-C  Anesthesia: General EBL: 550 cc Antibiotics: 2 g IV Ancef  Complications: None  Indications: The patient is a 63 year old gentleman with well-documented severe end-stage bone-on-bone arthritis of his right hip.  At this point his hip pain is daily and it is detrimentally affecting his mobility, his quality of life and his actives daily living to the point we have recommended a total hip arthroplasty and he does wish to proceed with this as well.  We did discuss in length in detail the risks of acute blood loss anemia, nerve and vessel injury, fracture, infection, dislocation, DVT, implant failure, leg length differences and wound healing issues.  He understands that our goals are hopefully decreased pain, improved mobility and improved quality of life.  Procedure description: After informed consent was obtained the appropriate right hip was marked, the patient was brought to the operating room and set up on the stretcher where spinal anesthesia was obtained.  He was then  laid in supine position on the stretcher and a Foley catheter was placed.  We assessed his leg length and then placed traction boots on both his feet.  Next he was placed supine on the Hana fracture table with a perineal post in place and both legs in inline skeletal traction devices but no traction applied.  His right operative hip and pelvis were assessed radiographically.  The right hip was prepped and draped with DuraPrep and sterile drapes.  A timeout was called and he was identified as the correct patient the correct right hip.  An incision was then made just inferior and posterior to the ASIS and carried slightly bigger than the leg.  Dissection was carried down to the tensor fascia lata muscle and the tensor fascia was then divided longitudinally to proceed with a direct interposed the hip.  Circumflex vessels were identified and cauterized.  The hip capsule was identified and opened up finding a very large joint effusion.  Cobra retractors were placed around the medial and lateral femoral neck and a femoral neck cut was made with an oscillating saw just proximal to the lesser trochanter and this cut was completed with an osteotome.  A corkscrew guide is placed in the femoral head and the femoral head was removed in its entirety and was very large and completely devoid of cartilage.  He did have significant synovitis and certainly bleeding as a result of this during the case but no significant vessels to cauterize.  He has a ridge of blood supply to his bone as well as what we found.  A bent Hohmann was placed over the medial acetabular rim and remnants of the sterile labrum  and other debris removed.  We then reamed all the way up to a size 62 reamer with all reamers placed under direct visualization in the last 2 reamers placed under direct fluoroscopy in order to obtain the depth and reaming, the inclination and anteversion.  We then placed the real DePuy sector GRIPTION acetabular component size 62 and a  36+4 polythene liner.  Attention was then turned to the femur.  With the right leg externally rotated to 120 degrees, extended and adducted, a Mueller retractors placed medially and Hohmann tract around the greater trochanter.  The lateral joint capsule was released and a box cutting osteotome was used in the femoral canal.  Broaching was initiated using the Actis broaching system from a size 0 going all the way to the size 7.  This was done in stepwise increments.  With a size 7 in place we trialed a high offset femoral neck and a 36+1.5 trial hip ball.  The right leg was brought over and up with traction and internal rotation reduced in the pelvis.  Based on radiographic and clinical assessment we are pleased with offset, leg length and stability.  We then dislocated the hip remove the trial components.  We placed the real Actis femoral component with high offset size 7 and the real 36+1.5 ceramic head ball and again reduces the acetabulum we are pleased with stability.  We assessed it radiographically and mechanically.  We then irrigated the soft tissue with normal saline solution.  The joint capsule was closed with interrupted #1 Ethibond suture followed by normal Vicryl close tensor fascia.  0 Vicryl was used to close deep tissue and 2-0 Vicryl was used to close subcutaneous tissue.  The skin was closed with staples and Aquacel dressing was applied.  The patient was taken off the Hana table, awakened, extubated and taken assisted in the entire case and beginning to end and his assistance was crucial and medically necessary for soft tissue management and retraction, helping guide implant placement and a layered closure of the wound.

## 2024-01-02 NOTE — Discharge Instructions (Signed)
 Per [facility] clinic policy, our goal is ensure optimal postoperative pain control with a multimodal pain management strategy. For all [facility] patients, our goal is to wean post-operative narcotic medications by 6 weeks post-operatively. If this is not possible due to utilization of pain medication prior to surgery, your [facility] doctor will support your acute post-operative pain control for the first 6 weeks postoperatively, with a plan to transition you back to your primary pain team following that. Alfred Delgado will work to ensure a Therapist, occupational.  INSTRUCTIONS AFTER JOINT REPLACEMENT   Remove items at home which could result in a fall. This includes throw rugs or furniture in walking pathways ICE to the affected joint every three hours while awake for 30 minutes at a time, for at least the first 3-5 days, and then as needed for pain and swelling.  Continue to use ice for pain and swelling. You may notice swelling that will progress down to the foot and ankle.  This is normal after surgery.  Elevate your leg when you are not up walking on it.   Continue to use the breathing machine you got in the hospital (incentive spirometer) which will help keep your temperature down.  It is common for your temperature to cycle up and down following surgery, especially at night when you are not up moving around and exerting yourself.  The breathing machine keeps your lungs expanded and your temperature down.   DIET:  As you were doing prior to hospitalization, we recommend a well-balanced diet.  DRESSING / WOUND CARE / SHOWERING  Keep the surgical dressing until follow up.  The dressing is water proof, so you can shower without any extra covering.  IF THE DRESSING FALLS OFF or the wound gets wet inside, change the dressing with sterile gauze.  Please use good hand washing techniques before changing the dressing.  Do not use any lotions or creams on the incision until instructed by your surgeon.     ACTIVITY  Increase activity slowly as tolerated, but follow the weight bearing instructions below.   No driving for 6 weeks or until further direction given by your physician.  You cannot drive while taking narcotics.  No lifting or carrying greater than 10 lbs. until further directed by your surgeon. Avoid periods of inactivity such as sitting longer than an hour when not asleep. This helps prevent blood clots.  You may return to work once you are authorized by your doctor.     WEIGHT BEARING   Weight bearing as tolerated with assist device (walker, cane, etc) as directed, use it as long as suggested by your surgeon or therapist, typically at least 4-6 weeks.   EXERCISES  Results after joint replacement surgery are often greatly improved when you follow the exercise, range of motion and muscle strengthening exercises prescribed by your doctor. Safety measures are also important to protect the joint from further injury. Any time any of these exercises cause you to have increased pain or swelling, decrease what you are doing until you are comfortable again and then slowly increase them. If you have problems or questions, call your caregiver or physical therapist for advice.   Rehabilitation is important following a joint replacement. After just a few days of immobilization, the muscles of the leg can become weakened and shrink (atrophy).  These exercises are designed to build up the tone and strength of the thigh and leg muscles and to improve motion. Often times heat used for twenty to thirty minutes before  working out will loosen up your tissues and help with improving the range of motion but do not use heat for the first two weeks following surgery (sometimes heat can increase post-operative swelling).   These exercises can be done on a training (exercise) mat, on the floor, on a table or on a bed. Use whatever works the best and is most comfortable for you.    Use music or television  while you are exercising so that the exercises are a pleasant break in your day. This will make your life better with the exercises acting as a break in your routine that you can look forward to.   Perform all exercises about fifteen times, three times per day or as directed.  You should exercise both the operative leg and the other leg as well.  Exercises include:   Quad Sets - Tighten up the muscle on the front of the thigh (Quad) and hold for 5-10 seconds.   Straight Leg Raises - With your knee straight (if you were given a brace, keep it on), lift the leg to 60 degrees, hold for 3 seconds, and slowly lower the leg.  Perform this exercise against resistance later as your leg gets stronger.  Leg Slides: Lying on your back, slowly slide your foot toward your buttocks, bending your knee up off the floor (only go as far as is comfortable). Then slowly slide your foot back down until your leg is flat on the floor again.  Angel Wings: Lying on your back spread your legs to the side as far apart as you can without causing discomfort.  Hamstring Strength:  Lying on your back, push your heel against the floor with your leg straight by tightening up the muscles of your buttocks.  Repeat, but this time bend your knee to a comfortable angle, and push your heel against the floor.  You may put a pillow under the heel to make it more comfortable if necessary.   A rehabilitation program following joint replacement surgery can speed recovery and prevent re-injury in the future due to weakened muscles. Contact your doctor or a physical therapist for more information on knee rehabilitation.    CONSTIPATION  Constipation is defined medically as fewer than three stools per week and severe constipation as less than one stool per week.  Even if you have a regular bowel pattern at home, your normal regimen is likely to be disrupted due to multiple reasons following surgery.  Combination of anesthesia, postoperative  narcotics, change in appetite and fluid intake all can affect your bowels.   YOU MUST use at least one of the following options; they are listed in order of increasing strength to get the job done.  They are all available over the counter, and you may need to use some, POSSIBLY even all of these options:    Drink plenty of fluids (prune juice may be helpful) and high fiber foods Colace 100 mg by mouth twice a day  Senokot for constipation as directed and as needed Dulcolax (bisacodyl), take with full glass of water  Miralax (polyethylene glycol) once or twice a day as needed.  If you have tried all these things and are unable to have a bowel movement in the first 3-4 days after surgery call either your surgeon or your primary doctor.    If you experience loose stools or diarrhea, hold the medications until you stool forms back up.  If your symptoms do not get better within 1 week  or if they get worse, check with your doctor.  If you experience "the worst abdominal pain ever" or develop nausea or vomiting, please contact the office immediately for further recommendations for treatment.   ITCHING:  If you experience itching with your medications, try taking only a single pain pill, or even half a pain pill at a time.  You can also use Benadryl over the counter for itching or also to help with sleep.   TED HOSE STOCKINGS:  Use stockings on both legs until for at least 2 weeks or as directed by physician office. They may be removed at night for sleeping.  MEDICATIONS:  See your medication summary on the "After Visit Summary" that nursing will review with you.  You may have some home medications which will be placed on hold until you complete the course of blood thinner medication.  It is important for you to complete the blood thinner medication as prescribed.  PRECAUTIONS:  If you experience chest pain or shortness of breath - call 911 immediately for transfer to the hospital emergency department.    If you develop a fever greater that 101 F, purulent drainage from wound, increased redness or drainage from wound, foul odor from the wound/dressing, or calf pain - CONTACT YOUR SURGEON.                                                   FOLLOW-UP APPOINTMENTS:  If you do not already have a post-op appointment, please call the office for an appointment to be seen by your surgeon.  Guidelines for how soon to be seen are listed in your "After Visit Summary", but are typically between 1-4 weeks after surgery.  OTHER INSTRUCTIONS:   Knee Replacement:  Do not place pillow under knee, focus on keeping the knee straight while resting. CPM instructions: 0-90 degrees, 2 hours in the morning, 2 hours in the afternoon, and 2 hours in the evening. Place foam block, curve side up under heel at all times except when in CPM or when walking.  DO NOT modify, tear, cut, or change the foam block in any way.  POST-OPERATIVE OPIOID TAPER INSTRUCTIONS: It is important to wean off of your opioid medication as soon as possible. If you do not need pain medication after your surgery it is ok to stop day one. Opioids include: Codeine, Hydrocodone(Norco, Vicodin), Oxycodone(Percocet, oxycontin) and hydromorphone amongst others.  Long term and even short term use of opiods can cause: Increased pain response Dependence Constipation Depression Respiratory depression And more.  Withdrawal symptoms can include Flu like symptoms Nausea, vomiting And more Techniques to manage these symptoms Hydrate well Eat regular healthy meals Stay active Use relaxation techniques(deep breathing, meditating, yoga) Do Not substitute Alcohol to help with tapering If you have been on opioids for less than two weeks and do not have pain than it is ok to stop all together.  Plan to wean off of opioids This plan should start within one week post op of your joint replacement. Maintain the same interval or time between taking each dose  and first decrease the dose.  Cut the total daily intake of opioids by one tablet each day Next start to increase the time between doses. The last dose that should be eliminated is the evening dose.   MAKE SURE YOU:  Understand these instructions.  Get help right away if you are not doing well or get worse.    Thank you for letting us be a part of your medical care team.  It is a privilege we respect greatly.  We hope these instructions will help you stay on track for a fast and full recovery!      Dental Antibiotics:  In most cases prophylactic antibiotics for Dental procdeures after total joint surgery are not necessary.  Exceptions are as follows:  1. History of prior total joint infection  2. Severely immunocompromised (Organ Transplant, cancer chemotherapy, Rheumatoid biologic meds such as Humera)  3. Poorly controlled diabetes (A1C &gt; 8.0, blood glucose over 200)  If you have one of these conditions, contact your surgeon for an antibiotic prescription, prior to your dental procedure.

## 2024-01-02 NOTE — Anesthesia Postprocedure Evaluation (Signed)
 Anesthesia Post Note  Patient: Alfred Delgado  Procedure(s) Performed: ARTHROPLASTY, HIP, TOTAL, ANTERIOR APPROACH (Right: Hip)     Patient location during evaluation: PACU Anesthesia Type: General Level of consciousness: awake Pain management: pain level controlled Vital Signs Assessment: post-procedure vital signs reviewed and stable Respiratory status: spontaneous breathing, nonlabored ventilation and respiratory function stable Cardiovascular status: blood pressure returned to baseline and stable Postop Assessment: no apparent nausea or vomiting Anesthetic complications: no   No notable events documented.  Last Vitals:  Vitals:   01/02/24 1500 01/02/24 1515  BP: 131/78   Pulse: 79 87  Resp: 10 10  Temp:    SpO2: 99% 99%    Last Pain:  Vitals:   01/02/24 1515  TempSrc:   PainSc: Asleep                 [name]

## 2024-01-03 DIAGNOSIS — M1611 Unilateral primary osteoarthritis, right hip: Secondary | ICD-10-CM | POA: Diagnosis not present

## 2024-01-03 LAB — BASIC METABOLIC PANEL WITH GFR
Anion gap: 9 (ref 5–15)
BUN: 11 mg/dL (ref 8–23)
CO2: 24 mmol/L (ref 22–32)
Calcium: 8.8 mg/dL — ABNORMAL LOW (ref 8.9–10.3)
Chloride: 98 mmol/L (ref 98–111)
Creatinine, Ser: 0.57 mg/dL — ABNORMAL LOW (ref 0.61–1.24)
GFR, Estimated: >60 mL/min (ref >60)
Glucose, Bld: 143 mg/dL — ABNORMAL HIGH (ref 70–99)
Potassium: 4.2 mmol/L (ref 3.5–5.1)
Sodium: 131 mmol/L — ABNORMAL LOW (ref 135–145)

## 2024-01-03 LAB — CBC
HCT: 33.6 % — ABNORMAL LOW (ref 39.0–52.0)
Hemoglobin: 11 g/dL — ABNORMAL LOW (ref 13.0–17.0)
MCH: 30.1 pg (ref 26.0–34.0)
MCHC: 32.7 g/dL (ref 30.0–36.0)
MCV: 91.8 fL (ref 80.0–100.0)
Platelets: 206 10*3/uL (ref 150–400)
RBC: 3.66 MIL/uL — ABNORMAL LOW (ref 4.22–5.81)
RDW: 13.5 % (ref 11.5–15.5)
WBC: 8 10*3/uL (ref 4.0–10.5)
nRBC: 0 % (ref 0.0–0.2)

## 2024-01-03 MED ORDER — ASPIRIN 81 MG PO CHEW: 81.0000 mg | CHEWABLE_TABLET | Freq: Two times a day (BID) | ORAL | 0 refills | Status: DC

## 2024-01-03 MED ORDER — METHOCARBAMOL 500 MG PO TABS: 500.0000 mg | ORAL_TABLET | Freq: Four times a day (QID) | ORAL | 0 refills | Status: DC | PRN

## 2024-01-03 MED ORDER — OXYCODONE HCL 5 MG PO TABS: 5.0000 mg | ORAL_TABLET | Freq: Four times a day (QID) | ORAL | 0 refills | Status: DC | PRN

## 2024-01-03 NOTE — Progress Notes (Signed)
 AVS reviewed w/ pt who verbalized an understanding. PIV removed as noted. Pt dressed for d/c to home. Walker delivered to room prior to discharge

## 2024-01-03 NOTE — Evaluation (Signed)
 Physical Therapy Evaluation Patient Details Name: Alfred Delgado MRN: [id number] DOB: 10/26/1960 Today's Date: 01/03/2024  History of Present Illness  Pt s/p R THR  Clinical Impression  Pt s/p R THR and present with decreased R LE strength/ROM and post op pain limiting functional mobility.  Pt should progress to dc home with family assist and follow up HHPT.      If plan is discharge home, recommend the following:     Can travel by private vehicle        Equipment Recommendations Rolling walker (2 wheels)  Recommendations for Other Services       Functional Status Assessment Patient has had a recent decline in their functional status and demonstrates the ability to make significant improvements in function in a reasonable and predictable amount of time.     Precautions / Restrictions Precautions Precautions: Fall Restrictions Weight Bearing Restrictions Per Provider Order: Yes RLE Weight Bearing Per Provider Order: Weight bearing as tolerated      Mobility  Bed Mobility Overal bed mobility: Needs Assistance Bed Mobility: Supine to Sit     Supine to sit: Min assist     General bed mobility comments: cues for LE management and use of UEs to self assist; Pt self assisting R LE with UEs - states he had to do this prior to surgery    Transfers Overall transfer level: Needs assistance Equipment used: Rolling walker (2 wheels) Transfers: Sit to/from Stand Sit to Stand: Min assist, Contact guard assist           General transfer comment: cues for LE management and use of UEs to self assist    Ambulation/Gait Ambulation/Gait assistance: Min assist, Contact guard assist Gait Distance (Feet): 58 Feet Assistive device: Rolling walker (2 wheels) Gait Pattern/deviations: Step-to pattern, Decreased step length - right, Decreased step length - left, Shuffle, Trunk flexed Gait velocity: decr     General Gait Details: cues for sequence, posture and position from RW:  distance ltd by fatigue  Stairs            Wheelchair Mobility     Tilt Bed    Modified Rankin (Stroke Patients Only)       Balance Overall balance assessment: Mild deficits observed, not formally tested                                           Pertinent Vitals/Pain Pain Assessment Pain Assessment: 0-10 Pain Score: 5  Pain Location: R hip/thigh Pain Descriptors / Indicators: Aching, Sore Pain Intervention(s): Limited activity within patient's tolerance, Monitored during session, Premedicated before session, Ice applied    Home Living Family/patient expects to be discharged to:: Private residence Living Arrangements: Spouse/significant other Available Help at Discharge: Family;Available 24 hours/day Type of Home: House Home Access: Stairs to enter Entrance Stairs-Rails: Doctor, general practice of Steps: 5   Home Layout: One level Home Equipment: Cane - single point      Prior Function Prior Level of Function : Independent/Modified Independent                     Extremity/Trunk Assessment   Upper Extremity Assessment Upper Extremity Assessment: Overall WFL for tasks assessed    Lower Extremity Assessment Lower Extremity Assessment: RLE deficits/detail RLE Deficits / Details: AAROM at hip to 80 flex and 10 abd; strenth at hip ~2/5    Cervical /  Trunk Assessment Cervical / Trunk Assessment: Normal  Communication   Communication Communication: No apparent difficulties    Cognition Arousal: Alert Behavior During Therapy: WFL for tasks assessed/performed   PT - Cognitive impairments: No apparent impairments                         Following commands: Intact       Cueing Cueing Techniques: Verbal cues     General Comments      Exercises Total Joint Exercises Ankle Circles/Pumps: AROM, Both, 15 reps, Supine Quad Sets: AROM, Both, 10 reps, Supine Heel Slides: AAROM, Right, 20 reps, Supine Hip  ABduction/ADduction: AAROM, Right, 15 reps, Supine   Assessment/Plan    PT Assessment Patient needs continued PT services  PT Problem List Decreased strength;Decreased range of motion;Decreased activity tolerance;Decreased balance;Decreased mobility;Decreased knowledge of use of DME;Pain       PT Treatment Interventions DME instruction;Gait training;Stair training;Therapeutic exercise;Therapeutic activities;Functional mobility training;Patient/family education    PT Goals (Current goals can be found in the Care Plan section)  Acute Rehab PT Goals Patient Stated Goal: Regain IND PT Goal Formulation: With patient Time For Goal Achievement: 01/09/24 Potential to Achieve Goals: Good    Frequency 7X/week     Co-evaluation               AM-PAC PT 6 Clicks Mobility  Outcome Measure Help needed turning from your back to your side while in a flat bed without using bedrails?: A Little Help needed moving from lying on your back to sitting on the side of a flat bed without using bedrails?: A Little Help needed moving to and from a bed to a chair (including a wheelchair)?: A Little Help needed standing up from a chair using your arms (e.g., wheelchair or bedside chair)?: A Little Help needed to walk in hospital room?: A Little Help needed climbing 3-5 steps with a railing? : A Lot 6 Click Score: 17    End of Session Equipment Utilized During Treatment: Gait belt Activity Tolerance: Patient tolerated treatment well Patient left: in chair;with call bell/phone within reach;with chair alarm set Nurse Communication: Mobility status PT Visit Diagnosis: Difficulty in walking, not elsewhere classified (R26.2)    Time: 8993-8954 PT Time Calculation (min) (ACUTE ONLY): 39 min   Charges:   PT Evaluation $PT Eval Low Complexity: 1 Low PT Treatments $Therapeutic Exercise: 8-22 mins PT General Charges $$ ACUTE PT VISIT: 1 Visit         [facility] PT Acute Rehabilitation  Services Office [phone]   [facility] 01/03/2024, 3:49 PM

## 2024-01-03 NOTE — Discharge Summary (Signed)
 Patient ID: Alfred Delgado MRN: [id number] DOB/AGE: [birth year] 63 y.o.  Admit date: 01/02/2024 Discharge date: 01/03/2024  Admission Diagnoses:  Principal Problem:   Unilateral primary osteoarthritis, right hip Active Problems:   Status post total replacement of right hip   Discharge Diagnoses:  Same  Past Medical History:  Diagnosis Date   Allergy    Anemia    Arthritis    Asthma    BELCHING 07/23/2010   Qualifier: Diagnosis of  By: Inocencio MD, [place] LABOR    CHEST PAIN 06/25/2010   Qualifier: Diagnosis of  By: Inocencio MD, [place] LABOR    DVT of lower extremity (deep venous thrombosis) (HCC)    History of colon polyps    History of colonic diverticulitis    LUQ PAIN 06/25/2010   Qualifier: Diagnosis of  By: Inocencio MD, [place] A    Multiple lipomas    History of   NEOPLASM, MALIGNANT, ESOPHAGUS 08/30/2010   Pre-diabetes     Surgeries: Procedure(s): ARTHROPLASTY, HIP, TOTAL, ANTERIOR APPROACH on 01/02/2024   Consultants:   Discharged Condition: Improved  Hospital Course: Alfred Delgado is an 63 y.o. male who was admitted 01/02/2024 for operative treatment ofUnilateral primary osteoarthritis, right hip. Patient has severe unremitting pain that affects sleep, daily activities, and work/hobbies. After pre-op clearance the patient was taken to the operating room on 01/02/2024 and underwent  Procedure(s): ARTHROPLASTY, HIP, TOTAL, ANTERIOR APPROACH.    Patient was given perioperative antibiotics:  Anti-infectives (From admission, onward)    Start     Dose/Rate Route Frequency Ordered Stop   01/02/24 1930  ceFAZolin  (ANCEF ) IVPB 2g/100 mL premix        2 g 200 mL/hr over 30 Minutes Intravenous Every 6 hours 01/02/24 1646 01/03/24 0306   01/02/24 1100  ceFAZolin  (ANCEF ) IVPB 2g/100 mL premix        2 g 200 mL/hr over 30 Minutes Intravenous On call to O.R. 01/02/24 1051 01/02/24 1311        Patient was given sequential compression devices, early ambulation, and  chemoprophylaxis to prevent DVT.  Patient benefited maximally from hospital stay and there were no complications.    Recent vital signs: Patient Vitals for the past 24 hrs:  BP Temp Temp src Pulse Resp SpO2  01/03/24 1325 117/73 98.2 F (36.8 C) Oral 82 17 98 %  01/03/24 0938 127/75 98.5 F (36.9 C) Oral 92 16 99 %  01/03/24 0536 111/71 97.9 F (36.6 C) Oral 87 17 97 %  01/03/24 0156 127/81 97.7 F (36.5 C) Oral 91 17 100 %  01/02/24 2208 110/77 98.1 F (36.7 C) Oral 99 16 99 %     Recent laboratory studies:  Recent Labs    01/03/24 0353  WBC 8.0  HGB 11.0*  HCT 33.6*  PLT 206  NA 131*  K 4.2  CL 98  CO2 24  BUN 11  CREATININE 0.57*  GLUCOSE 143*  CALCIUM  8.8*     Discharge Medications:   Allergies as of 01/03/2024   No Known Allergies      Medication List     TAKE these medications    albuterol  108 (90 Base) MCG/ACT inhaler Commonly known as: VENTOLIN  HFA Inhale 2 puffs into the lungs every 6 (six) hours as needed for wheezing or shortness of breath.   aspirin 81 MG chewable tablet Chew 1 tablet (81 mg total) by mouth 2 (two) times daily.   methocarbamol 500 MG tablet Commonly known as: ROBAXIN Take 1 tablet (500 mg  total) by mouth every 6 (six) hours as needed for muscle spasms.   naproxen sodium 220 MG tablet Commonly known as: ALEVE Take 220-440 mg by mouth 2 (two) times daily.   oxyCODONE 5 MG immediate release tablet Commonly known as: Oxy IR/ROXICODONE Take 1-2 tablets (5-10 mg total) by mouth every 6 (six) hours as needed for moderate pain (pain score 4-6) (pain score 4-6).               Durable Medical Equipment  (From admission, onward)           Start     Ordered   01/02/24 1647  DME 3 n 1  Once        01/02/24 1646   01/02/24 1647  DME Walker rolling  Once       Question Answer Comment  Walker: With 5 Inch Wheels   Patient needs a walker to treat with the following condition Status post total replacement of right hip       01/02/24 1646            Diagnostic Studies: DG Pelvis Portable Result Date: 01/02/2024 CLINICAL DATA:  Status post right hip arthroplasty. EXAM: PORTABLE PELVIS 1-2 VIEWS COMPARISON:  None Available. FINDINGS: Right hip arthroplasty in expected alignment. No periprosthetic lucency or fracture. Recent postsurgical change includes air and edema in the soft tissues. Skin staples in place. IMPRESSION: Right hip arthroplasty without immediate postoperative complication. Electronically Signed   By: Andrea Gasman M.D.   On: 01/02/2024 15:18   DG HIP UNILAT WITH PELVIS 2-3 VIEWS RIGHT Result Date: 01/02/2024 CLINICAL DATA:  Elective surgery. EXAM: DG HIP (WITH OR WITHOUT PELVIS) 2-3V RIGHT COMPARISON:  None Available. FINDINGS: Three fluoroscopic spot views of the pelvis and right hip obtained in the operating room. Images during hip arthroplasty. Fluoroscopy time 37 seconds. Dose 3.64 mGy. IMPRESSION: Intraoperative fluoroscopy during right hip arthroplasty. Electronically Signed   By: Andrea Gasman M.D.   On: 01/02/2024 14:38   DG C-Arm 1-60 Min-No Report Result Date: 01/02/2024 Fluoroscopy was utilized by the requesting physician.  No radiographic interpretation.   DG C-Arm 1-60 Min-No Report Result Date: 01/02/2024 Fluoroscopy was utilized by the requesting physician.  No radiographic interpretation.    Disposition: Discharge disposition: 01-Home or Self Care          Follow-up Information     Vernetta Lonni GRADE, MD Follow up in 2 week(s).   Specialty: Orthopedic Surgery Contact information: [address] [place]  [place] [address] [phone]                  Signed: Lonni GRADE Vernetta 01/03/2024, 7:59 PM

## 2024-01-03 NOTE — Progress Notes (Signed)
 Physical Therapy Treatment Patient Details Name: Alfred Delgado MRN: [id number] DOB: June 27, 1961 Today's Date: 01/03/2024   History of Present Illness Pt s/p R THR    PT Comments  Pt progressing well with mobility including up to ambulate increased distance in hall, negotiated stairs, reviewed car transfers, reviewed dressing techniques and with questions asked and answered.  Pt eager for dc home this date.    If plan is discharge home, recommend the following:     Can travel by private vehicle        Equipment Recommendations  Rolling walker (2 wheels)    Recommendations for Other Services       Precautions / Restrictions Precautions Precautions: Fall Restrictions Weight Bearing Restrictions Per Provider Order: Yes RLE Weight Bearing Per Provider Order: Weight bearing as tolerated     Mobility  Bed Mobility Overal bed mobility: Needs Assistance Bed Mobility: Supine to Sit     Supine to sit: Min assist     General bed mobility comments: Pt up in chair and requests back to same    Transfers Overall transfer level: Needs assistance Equipment used: Rolling walker (2 wheels) Transfers: Sit to/from Stand Sit to Stand: Contact guard assist, Supervision           General transfer comment: cues for LE management and use of UEs to self assist    Ambulation/Gait Ambulation/Gait assistance: Contact guard assist, Supervision Gait Distance (Feet): 100 Feet Assistive device: Rolling walker (2 wheels) Gait Pattern/deviations: Step-to pattern, Decreased step length - right, Decreased step length - left, Shuffle, Trunk flexed Gait velocity: decr     General Gait Details: cues for sequence, posture and position from RW: distance ltd by fatigue   Stairs Stairs: Yes Stairs assistance: Min assist Stair Management: One rail Right, Step to pattern, Forwards, With cane Number of Stairs: 10 General stair comments: up/down 5 stairs with cues for sequence and cane/foot  placement   Wheelchair Mobility     Tilt Bed    Modified Rankin (Stroke Patients Only)       Balance Overall balance assessment: Mild deficits observed, not formally tested                                          Communication Communication Communication: No apparent difficulties  Cognition Arousal: Alert Behavior During Therapy: WFL for tasks assessed/performed   PT - Cognitive impairments: No apparent impairments                         Following commands: Intact      Cueing Cueing Techniques: Verbal cues  Exercises Total Joint Exercises Ankle Circles/Pumps: AROM, Both, 15 reps, Supine Quad Sets: AROM, Both, 10 reps, Supine Heel Slides: AAROM, Right, 20 reps, Supine Hip ABduction/ADduction: AAROM, Right, 15 reps, Supine    General Comments        Pertinent Vitals/Pain Pain Assessment Pain Assessment: 0-10 Pain Score: 5  Pain Location: R hip/thigh Pain Descriptors / Indicators: Aching, Sore Pain Intervention(s): Limited activity within patient's tolerance, Monitored during session, Premedicated before session    Home Living Family/patient expects to be discharged to:: Private residence Living Arrangements: Spouse/significant other Available Help at Discharge: Family;Available 24 hours/day Type of Home: House Home Access: Stairs to enter Entrance Stairs-Rails: Doctor, general practice of Steps: 5   Home Layout: One level Home Equipment: Cane - single point  Prior Function            PT Goals (current goals can now be found in the care plan section) Acute Rehab PT Goals Patient Stated Goal: Regain IND PT Goal Formulation: With patient Time For Goal Achievement: 01/09/24 Potential to Achieve Goals: Good Progress towards PT goals: Progressing toward goals    Frequency    7X/week      PT Plan      Co-evaluation              AM-PAC PT 6 Clicks Mobility   Outcome Measure  Help needed  turning from your back to your side while in a flat bed without using bedrails?: A Little Help needed moving from lying on your back to sitting on the side of a flat bed without using bedrails?: A Little Help needed moving to and from a bed to a chair (including a wheelchair)?: A Little Help needed standing up from a chair using your arms (e.g., wheelchair or bedside chair)?: A Little Help needed to walk in hospital room?: A Little Help needed climbing 3-5 steps with a railing? : A Little 6 Click Score: 18    End of Session Equipment Utilized During Treatment: Gait belt Activity Tolerance: Patient tolerated treatment well Patient left: in chair;with call bell/phone within reach;with chair alarm set Nurse Communication: Mobility status PT Visit Diagnosis: Difficulty in walking, not elsewhere classified (R26.2)     Time: 8594-8565 PT Time Calculation (min) (ACUTE ONLY): 29 min  Charges:    $Gait Training: 8-22 mins $Therapeutic Exercise: 8-22 mins $Therapeutic Activity: 8-22 mins PT General Charges $$ ACUTE PT VISIT: 1 Visit                     [facility] PT Acute Rehabilitation Services Office [phone]    [facility] 01/03/2024, 3:54 PM

## 2024-01-03 NOTE — Progress Notes (Signed)
 Subjective: 1 Day Post-Op Procedure(s) (LRB): ARTHROPLASTY, HIP, TOTAL, ANTERIOR APPROACH (Right) Patient reports pain as moderate.    Objective: Vital signs in last 24 hours: Temp:  [97.7 F (36.5 C)-98.5 F (36.9 C)] 98.5 F (36.9 C) (06/28 0938) Pulse Rate:  [79-99] 92 (06/28 0938) Resp:  [9-19] 16 (06/28 0938) BP: (110-131)/(71-82) 127/75 (06/28 0938) SpO2:  [93 %-100 %] 99 % (06/28 0938) Weight:  [98 kg] 98 kg (06/27 1848)  Intake/Output from previous day: 06/27 0701 - 06/28 0700 In: 3065 [P.O.:840; I.V.:1825; IV Piggyback:400] Out: 3350 [Urine:2850; Blood:500] Intake/Output this shift: No intake/output data recorded.  Recent Labs    01/03/24 0353  HGB 11.0*   Recent Labs    01/03/24 0353  WBC 8.0  RBC 3.66*  HCT 33.6*  PLT 206   Recent Labs    01/03/24 0353  NA 131*  K 4.2  CL 98  CO2 24  BUN 11  CREATININE 0.57*  GLUCOSE 143*  CALCIUM  8.8*   No results for input(s): LABPT, INR in the last 72 hours.  Sensation intact distally Intact pulses distally Dorsiflexion/Plantar flexion intact Incision: dressing C/D/I   Assessment/Plan: 1 Day Post-Op Procedure(s) (LRB): ARTHROPLASTY, HIP, TOTAL, ANTERIOR APPROACH (Right) Up with therapy Discharge home with home health      Alfred Delgado 01/03/2024, 12:08 PM

## 2024-01-03 NOTE — TOC Progression Note (Signed)
 Transition of Care [facility]) - Progression Note    Patient Details  Name: Alfred Delgado MRN: [id number] Date of Birth: 13-Mar-1961  [facility] CM/SW Contact  Lorraine LILLETTE Fenton, LCSW Phone Number: 01/03/2024, 3:09 PM  Clinical Narrative:     CSW reviewed HH information- [facility] accepted pt. [facility] later received from RN that pt waiting for a needed walker for DC. CSW visited pt in room, clarified if he had discussed a walker with his Dr./office, he stated yes he was told he would have  walker. No indication DME ordered -CSW contacted Jermaine at [facility] stated pt is discharging today and is requesting a walker. [employer] will deliver walker to pt- [facility] to nurse advising. No further TOC needs.     Barriers to Discharge: No Barriers Identified  Expected Discharge Plan and Services         Expected Discharge Date: 01/03/24                                     Social Determinants of Health (SDOH) Interventions SDOH Screenings   Food Insecurity: Patient Declined (01/02/2024)  Housing: Unknown (01/02/2024)  Transportation Needs: Patient Declined (01/02/2024)  Utilities: Patient Declined (01/02/2024)  Depression (PHQ2-9): Low Risk  (12/23/2022)  Tobacco Use: Medium Risk (01/02/2024)    Readmission Risk Interventions     No data to display

## 2024-01-05 ENCOUNTER — Encounter (HOSPITAL_COMMUNITY): Payer: Self-pay | Admitting: Orthopaedic Surgery

## 2024-01-15 ENCOUNTER — Ambulatory Visit: Admitting: Orthopaedic Surgery

## 2024-01-15 ENCOUNTER — Encounter: Payer: Self-pay | Admitting: Orthopaedic Surgery

## 2024-01-15 DIAGNOSIS — Z96641 Presence of right artificial hip joint: Secondary | ICD-10-CM

## 2024-01-15 NOTE — Progress Notes (Signed)
 The patient is here for his first postoperative visit status post a right total hip replacement.  He is doing well overall.  He is already off of pain medication.  He is walking with just a walking stick.  He has been compliant with a baby aspirin  twice daily.  His right hip incision looks good.  Staples been removed and Steri-Strips applied.  He does have a moderate seroma and I did aspirate about 50 cc of fluid off of the hip.  His leg lengths are equal.  He will continue to increase his activities as tolerated.  He should hold off on any type of major exercises.  He can stop his baby aspirin  twice daily.  Will see him back in a month to see how he is doing overall from a mobility standpoint but no x-rays are needed.  He can also drive.

## 2024-02-12 ENCOUNTER — Encounter: Payer: Self-pay | Admitting: Orthopaedic Surgery

## 2024-02-12 ENCOUNTER — Ambulatory Visit (INDEPENDENT_AMBULATORY_CARE_PROVIDER_SITE_OTHER): Admitting: Orthopaedic Surgery

## 2024-02-12 DIAGNOSIS — Z96641 Presence of right artificial hip joint: Secondary | ICD-10-CM

## 2024-02-12 NOTE — Progress Notes (Signed)
 The patient is here today as 6 weeks status post a right total hip replacement to treat significant right hip arthritis and pain.  He is doing well overall.  He is having normal aches and pains as a relates to recovering from hip replacement surgery.  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  This requires him to stoop and bend and crawl and climb as well as push and pull and lift heavy objects.  I am not will comfortable in returning to that type of work until Tuesday, March 09, 2024.  We will give him a letter reflecting that.  Otherwise his right operative hip is moving smoothly and fluidly.  His leg lengths appear equal.  He does still have some swelling and stiffness to be expected.  From our standpoint we will see him back in 6 months with standing AP pelvis and lateral of his right operative hip.  Again we will allow him to rehabilitate his hip for several weeks with return to work on Tuesday, September 2 without restrictions.

## 2024-05-10 ENCOUNTER — Encounter: Payer: Self-pay | Admitting: Radiology

## 2024-07-11 ENCOUNTER — Other Ambulatory Visit: Payer: Self-pay

## 2024-07-11 ENCOUNTER — Emergency Department (HOSPITAL_BASED_OUTPATIENT_CLINIC_OR_DEPARTMENT_OTHER)
Admission: EM | Admit: 2024-07-11 | Discharge: 2024-07-11 | Disposition: A | Discharge status: Home / Self Care | Source: Home / Self Care | Attending: Emergency Medicine | Admitting: Emergency Medicine

## 2024-07-11 ENCOUNTER — Encounter (HOSPITAL_BASED_OUTPATIENT_CLINIC_OR_DEPARTMENT_OTHER): Payer: Self-pay

## 2024-07-11 ENCOUNTER — Telehealth (HOSPITAL_BASED_OUTPATIENT_CLINIC_OR_DEPARTMENT_OTHER): Payer: Self-pay | Admitting: Emergency Medicine

## 2024-07-11 ENCOUNTER — Emergency Department (HOSPITAL_BASED_OUTPATIENT_CLINIC_OR_DEPARTMENT_OTHER): Admit: 2024-07-11 | Discharge: 2024-07-11 | Disposition: A | Attending: Emergency Medicine

## 2024-07-11 DIAGNOSIS — J45909 Unspecified asthma, uncomplicated: Secondary | ICD-10-CM | POA: Insufficient documentation

## 2024-07-11 DIAGNOSIS — M79662 Pain in left lower leg: Secondary | ICD-10-CM | POA: Diagnosis present

## 2024-07-11 DIAGNOSIS — Z8501 Personal history of malignant neoplasm of esophagus: Secondary | ICD-10-CM | POA: Insufficient documentation

## 2024-07-11 DIAGNOSIS — R2242 Localized swelling, mass and lump, left lower limb: Secondary | ICD-10-CM | POA: Insufficient documentation

## 2024-07-11 DIAGNOSIS — Z87891 Personal history of nicotine dependence: Secondary | ICD-10-CM | POA: Insufficient documentation

## 2024-07-11 DIAGNOSIS — M79605 Pain in left leg: Secondary | ICD-10-CM

## 2024-07-11 MED ORDER — APIXABAN 5 MG PO TABS: 5.0000 mg | ORAL_TABLET | Freq: Two times a day (BID) | ORAL | 0 refills | Status: DC

## 2024-07-11 MED ORDER — DOXYCYCLINE HYCLATE 100 MG PO TABS
100.0000 mg | ORAL_TABLET | Freq: Once | ORAL | Status: AC
  Administered 2024-07-11: 100 mg via ORAL
  Filled 2024-07-11: qty 1

## 2024-07-11 MED ORDER — DOXYCYCLINE HYCLATE 100 MG PO CAPS: 100.0000 mg | ORAL_CAPSULE | Freq: Two times a day (BID) | ORAL | 0 refills | Status: DC

## 2024-07-11 NOTE — ED Notes (Signed)
 This RN coordinated with radiology team member to schedule patient for outpatient US  today at 1300. Patient informed.

## 2024-07-11 NOTE — Discharge Instructions (Signed)
 You were evaluated in the Emergency Department and after careful evaluation, we did not find any emergent condition requiring admission or further testing in the hospital.  Your exam/testing today is overall reassuring.  Recommend returning later today for ultrasound to evaluate for blood clot as we discussed.  Recommend taking the doxycycline  antibiotic as directed and following up closely with your primary care doctor for reassessment of this area on your leg.  Please return to the Emergency Department if you experience any worsening of your condition.   Thank you for allowing us  to be a part of your care.

## 2024-07-11 NOTE — ED Provider Notes (Signed)
 " [facility]-EM[facility]ENCY DEPT [facility] Emergency Department Provider Note MRN:  [id number]  Arrival date & time: 07/11/2024     Chief Complaint   Leg Pain   History of Present Illness   Alfred Delgado is a 64 y.o. year-old male with a history of esophageal cancer presenting to the ED with chief complaint of leg pain.  Pain redness and swelling to the medial aspect of the left lower leg for the past 2 days.  Was very active dealing with leaves in the backyard preceding this.  Pain was higher up in the calf with redness and swelling but now it has lowered closer to the ankle.  Has had a blood clot in this leg.  Denies fever, no chest pain or shortness of breath, no other complaints.  Review of Systems  A thorough review of systems was obtained and all systems are negative except as noted in the HPI and PMH.   Patient's Health History    Past Medical History:  Diagnosis Date   Allergy    Anemia    Arthritis    Asthma    BELCHING 07/23/2010   Qualifier: Diagnosis of  By: Inocencio MD, Ber[place] LABOR    CHEST PAIN 06/25/2010   Qualifier: Diagnosis of  By: Inocencio MD, Berwyn A    DVT of lower extremity (deep venous thrombosis) (HCC)    History of colon polyps    History of colonic diverticulitis    LUQ PAIN 06/25/2010   Qualifier: Diagnosis of  By: Inocencio MD, Berwyn A    Multiple lipomas    History of   NEOPLASM, MALIGNANT, ESOPHAGUS 08/30/2010   Pre-diabetes     Past Surgical History:  Procedure Laterality Date   COLONOSCOPY  last 2013   ESOPHAGOGASTRECTOMY  2012   Dr. Dickey [place]   POLYPECTOMY     PORTA CATH REMOVAL     PORTACATH PLACEMENT  03/14/2011   TONSILLECTOMY     TOTAL HIP ARTHROPLASTY Right 01/02/2024   Procedure: ARTHROPLASTY, HIP, TOTAL, ANTERIOR APPROACH;  Surgeon: Vernetta Lonni GRADE, MD;  Location: [facility];  Service: Orthopedics;  Laterality: Right;   UPPER GASTROINTESTINAL ENDOSCOPY      Family History  Problem Relation Age of Onset   Breast cancer  Mother    Thyroid  disease Mother    Throat cancer Father    Colon polyps Father    Esophageal cancer Father    Thyroid  disease Maternal Grandmother    Cancer Paternal Grandmother    Heart disease Paternal Grandfather    Colon cancer Neg Hx    Rectal cancer Neg Hx    Stomach cancer Neg Hx     Social History   Socioeconomic History   Marital status: Married    Spouse name: Not on file   Number of children: 0   Years of education: 12   Highest education level: Not on file  Occupational History   Occupation: Barrister's Clerk   Tobacco Use   Smoking status: Former    Current packs/day: 0.00    Average packs/day: 1.5 packs/day for 20.0 years (30.0 ttl pk-yrs)    Types: Cigarettes    Start date: 07/08/1977    Quit date: 07/08/1997    Years since quitting: 27.0   Smokeless tobacco: Never  Vaping Use   Vaping status: Never Used  Substance and Sexual Activity   Alcohol use: No    Alcohol/week: 0.0 standard drinks of alcohol   Drug use: No   Sexual activity: Not on  file  Other Topics Concern   Not on file  Social History Narrative   Fun: Motorcycle, walk, church, pottery, bass guitar   Denies religious beliefs effecting health care.    Social Drivers of Health   Tobacco Use: Medium Risk (07/11/2024)   Patient History    Smoking Tobacco Use: Former    Smokeless Tobacco Use: Never    Passive Exposure: Not on file  Financial Resource Strain: Not on file  Food Insecurity: Patient Declined (01/02/2024)   Epic    Worried About Programme Researcher, Broadcasting/film/video in the Last Year: Patient declined    Barista in the Last Year: Patient declined  Transportation Needs: Patient Declined (01/02/2024)   Epic    Lack of Transportation (Medical): Patient declined    Lack of Transportation (Non-Medical): Patient declined  Physical Activity: Not on file  Stress: Not on file  Social Connections: Not on file  Intimate Partner Violence: Patient Declined (01/02/2024)   Epic    Fear of Current or  Ex-Partner: Patient declined    Emotionally Abused: Patient declined    Physically Abused: Patient declined    Sexually Abused: Patient declined  Depression (PHQ2-9): Low Risk (12/23/2022)   Depression (PHQ2-9)    PHQ-2 Score: 0  Alcohol Screen: Not on file  Housing: Unknown (01/02/2024)   Epic    Unable to Pay for Housing in the Last Year: Patient declined    Number of Times Moved in the Last Year: 0    Homeless in the Last Year: Patient declined  Utilities: Patient Declined (01/02/2024)   Epic    Threatened with loss of utilities: Patient declined  Health Literacy: Not on file     Physical Exam   Vitals:   07/11/24 0143  BP: 125/83  Pulse: 74  Resp: 18  Temp: 97.8 F (36.6 C)  SpO2: 97%    CONSTITUTIONAL: Well-appearing, NAD NEURO/PSYCH:  Alert and oriented x 3, no focal deficits EYES:  eyes equal and reactive ENT/NECK:  no LAD, no JVD CARDIO: Regular rate, well-perfused, normal S1 and S2 PULM:  CTAB no wheezing or rhonchi GI/GU:  non-distended, non-tender MSK/SPINE:  No gross deformities, no edema SKIN: Atraumatic   *Additional and/or pertinent findings included in MDM below  Diagnostic and Interventional Summary    EKG Interpretation Date/Time:    Ventricular Rate:    PR Interval:    QRS Duration:    QT Interval:    QTC Calculation:   R Axis:      Text Interpretation:         Labs Reviewed - No data to display  US  Venous Img Lower Unilateral Left    (Results Pending)    Medications  doxycycline  (VIBRA -TABS) tablet 100 mg (has no administration in time range)     Procedures  /  Critical Care .Ultrasound ED Soft Tissue  Date/Time: 07/11/2024 2:34 AM  Performed by: Theadore Ozell HERO, MD Authorized by: Theadore Ozell HERO, MD   Procedure details:    Indications: limb pain     Transverse view:  Visualized   Longitudinal view:  Visualized   Images: archived   Location:    Location: lower extremity     Side:  Left Comments:     Nodule evaluated, not  consistent with abscess.  Mild cobblestoning surrounding this area.   ED Course and Medical Decision Making  Initial Impression and Ddx Patient is a localized area of erythema and increased warmth to the left lower leg, medial  aspect.  There is a firm nodule at the center of this area.  Favoring inflamed or infected lipoma rather than abscess.  Bedside ultrasound performed showing that this nodule has a tissue type density and has blood flow and so not consistent with abscess.  Unclear how or why patient was having more proximal pain and swelling.  Past medical/surgical history that increases complexity of ED encounter: Remote history of esophageal cancer  Interpretation of Diagnostics Laboratory and/or imaging options to aid in the diagnosis/care of the patient were considered.  After careful history and physical examination, it was determined that there was no indication for diagnostics at this time (with the exception of bedside ultrasound)  Patient Reassessment and Ultimate Disposition/Management     Patient will follow-up with primary care doctor for further evaluation of this nodule, providing antibiotics, he will also return tomorrow for ultrasound to more definitively exclude blood clot given his history.  Patient management required discussion with the following services or consulting groups:  None  Complexity of Problems Addressed Acute illness or injury that poses threat of life of bodily function  Additional Data Reviewed and Analyzed Further history obtained from: Further history from spouse/family member  Additional Factors Impacting ED Encounter Risk Prescriptions  Ozell HERO. Theadore, MD [facility] Emergency [facility] [email or web address]  Final Clinical Impressions(s) / ED Diagnoses     ICD-10-CM   1. Pain of left lower extremity  M79.605       ED Discharge Orders          Ordered    doxycycline  (VIBRAMYCIN ) 100 MG capsule  2 times  daily        07/11/24 0228    US  Venous Img Lower Unilateral Left        07/11/24 0228             Discharge Instructions Discussed with and Provided to Patient:    Discharge Instructions      You were evaluated in the Emergency Department and after careful evaluation, we did not find any emergent condition requiring admission or further testing in the hospital.  Your exam/testing today is overall reassuring.  Recommend returning later today for ultrasound to evaluate for blood clot as we discussed.  Recommend taking the doxycycline  antibiotic as directed and following up closely with your primary care doctor for reassessment of this area on your leg.  Please return to the Emergency Department if you experience any worsening of your condition.   Thank you for allowing us  to be a part of your care.      Theadore Ozell HERO, MD 07/11/24 0234  "

## 2024-07-11 NOTE — ED Triage Notes (Signed)
 Patient here POV from home.   Notes soreness to left posterior upper calf that began 1 week ago. No known injury or trauma. Since then subsided but has had swelling distally to same extremity. History of DVT in same extremity. Relief with walking.   No CP or Fevers.  NAD noted during triage. A&Ox4. GCS 15. Ambulatory.

## 2024-07-11 NOTE — Telephone Encounter (Signed)
 Patient called back for DVT ultrasound today.  Had ultrasound done which did show DVT in the popliteal vein.  Went and evaluated the patient, exam is consistent with DVT.  Sent Eliquis  to his pharmacy.  Reviewed his previous labs, should be an appropriate medication for the patient.  He has a PCP that he will follow-up with.  Advised patient to hold his aspirin .  Will have him not fill his doxycycline  prescription as this appears to be DVT and not cellulitis.

## 2024-07-11 NOTE — ED Notes (Signed)
RN provided AVS using Teachback Method. Patient verbalizes understanding of Discharge Instructions. Opportunity for Questioning and Answers were provided by RN. Patient Discharged from ED ambulatory to home with family. ? ?

## 2024-07-12 ENCOUNTER — Encounter: Payer: Self-pay | Admitting: Nurse Practitioner

## 2024-07-13 ENCOUNTER — Encounter: Payer: Self-pay | Admitting: Nurse Practitioner

## 2024-07-13 ENCOUNTER — Ambulatory Visit (INDEPENDENT_AMBULATORY_CARE_PROVIDER_SITE_OTHER): Admitting: Nurse Practitioner

## 2024-07-13 VITALS — BP 120/72 | HR 80 | Wt 210.4 lb

## 2024-07-13 DIAGNOSIS — I82432 Acute embolism and thrombosis of left popliteal vein: Secondary | ICD-10-CM

## 2024-07-13 DIAGNOSIS — I82462 Acute embolism and thrombosis of left calf muscular vein: Secondary | ICD-10-CM | POA: Insufficient documentation

## 2024-07-13 DIAGNOSIS — I8002 Phlebitis and thrombophlebitis of superficial vessels of left lower extremity: Secondary | ICD-10-CM | POA: Insufficient documentation

## 2024-07-13 NOTE — Assessment & Plan Note (Addendum)
 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  Hip surgery 6 months ago, no longer considered a contributing event due to guidelines. No severe symptoms such as shortness of breath or chest pain present at this time, which is reassuring. Exam benign with exception of erythema noted on the left medial calf. Differential includes potential clotting disorder, May-Thurner Syndrome,  but no immediate signs of malignancy or other systemic issues present at this time. It is unclear if clotting disorder has been evaluated. Eliquis  prescribed for anticoagulation by ED. Patient has questions about duration of therapy today. Given that this is a second event, typically long term management is recommended, but he does not wish to stay on this treatment for an extended period, if possible. I will review the history and previous labs to determine if additional testing is required. Also consider underlying venous vulnerability in the limb. Will consider referral to DVT clinic for complete evaluation and recommendations.  - Continue Eliquis , reassess duration based on further evaluation. Considering 2nd episode of DVT, this is considered unprovoked and typically ongoing anticoagulation would be recommended.  - Ordered kidney function tests and platelet count for monitoring - Consider consult with DVT clinic for further evaluation and recommendations to reduce the risk of recurrence.  - Testing to consider: APS Panel, Factor V Leiden, Prothrombin gene mutation, CT venography of the pelvis for iliac vein narrowing or collateral changes (MTS). - Advised wearing compression stockings during the day and elevating legs when sitting. - Recommended icing and warm compresses for symptomatic relief. No massage - Avoiding sitting for long periods. Recommend continue with daily activity.  - Instructed to monitor for signs of pulmonary embolism, such as  sudden shortness of breath, dizziness, or hemoptysis, and seek emergency care if these occur. - Advised against driving if experiencing severe symptoms. - Provided information on Eliquis  and signs of medical emergencies.  Orders:   CBC with Differential/Platelet   Comprehensive metabolic panel with GFR

## 2024-07-13 NOTE — Progress Notes (Unsigned)
 " Catheline Doing, DNP, AGNP-c [facility] [address] [place], [place] [address] Main Office [phone]  ESTABLISHED PATIENT- Hospital Follow-Up Visit on 07/13/2024 Today's Vitals   07/13/24 1557  BP: 120/72  Pulse: 80  Weight: 210 lb 6.4 oz (95.4 kg)   Body mass index is 27.01 kg/m.  Wt Readings from Last 3 Encounters:  07/13/24 210 lb 6.4 oz (95.4 kg)  01/02/24 216 lb 0.8 oz (98 kg)  12/22/23 196 lb (88.9 kg)     Subjective:  other (ED f/u for DVT, pain in lt. Lower leg for about a week, red and warm and was swollen, questions about Eliquis , )  Seen in ED 07/11/2024 for left calf pain and swelling.  Vascular US  on 07/12/2024 showed positive DVT of left popliteal vein and calf veins along with superficial thrombophlebitis of the left great saphenous veins of the calf. Started on Eliquis .  Dosing 5mg  BID  History of Present Illness Alfred Delgado is a 64 year old male with a history of esophageal cancer and previous blood clot who presents for hospital follow-up from leg pain and swelling with a finding for new DVT and superficial thrombophlebitis in the left lower leg.   He noticed swelling and warmth in his leg about 1-2 weeks ago, which started to worsen this past Saturday morning. He elevated his leg, which prompted concern from his wife, leading him to visit the emergency room at 2 AM Sunday morning. He reports that the emergency room performed an ultrasound on his leg at the bedside, but this was not revealing and he returned for a vascular ultrasound the following afternoon.  He has a history of a blood clot in the same leg during a hospital stay for cancer surgery in 2012, treated with Lovenox for four months. He underwent hip surgery this past summer. In general he is active, working daily and regular walking. He has no known blood clotting disorder. He did have a recent trip, but was already experiencing calf pain at that time and wore compression  socks during the trip.   He reports having taken three doses of Eliquis  so far since the emergency room visit. He feels 'a little off kilter' today and has a dry mouth, but no significant shortness of breath, chest pain, impending doom, coughing, or signs of bleeding. He has used his inhaler a few times over the past weeks, attributing it to allergy season and recent physical activity such as yard work. He feels that his inhaler use is related and consistent with his normal allergy symptoms.   No recent long trips or prolonged sitting, though he has been less active over the past year due to hip issues. He reports a family history of varicose veins, specifically in his father, and has noticed a bump in his leg initially thought to be varicose veins.  He has gained a few pounds over the holidays with no weight loss or night sweats.  ED Visit Only  Past medical history, surgical history, medications, allergies, family history and social history reviewed with patient today and changes made to appropriate areas of the chart.      Objective:    Physical Exam Vitals and nursing note reviewed.  Constitutional:      General: He is not in acute distress.    Appearance: Normal appearance. He is not ill-appearing.  HENT:     Head: Normocephalic.  Eyes:     Pupils: Pupils are equal, round, and reactive to light.  Neck:  Vascular: No carotid bruit.  Cardiovascular:     Rate and Rhythm: Normal rate and regular rhythm.     Pulses: Normal pulses.     Heart sounds: Normal heart sounds. No murmur heard. Pulmonary:     Effort: Pulmonary effort is normal.     Breath sounds: Normal breath sounds.  Abdominal:     General: Bowel sounds are normal.     Palpations: Abdomen is soft.  Musculoskeletal:        General: Normal range of motion.     Cervical back: Normal range of motion.     Right lower leg: No edema.     Left lower leg: Edema present.  Skin:    General: Skin is warm and dry.      Capillary Refill: Capillary refill takes less than 2 seconds.      Neurological:     General: No focal deficit present.     Mental Status: He is alert and oriented to person, place, and time.     Sensory: No sensory deficit.     Motor: No weakness.     Coordination: Coordination normal.  Psychiatric:        Mood and Affect: Mood normal.        Behavior: Behavior normal.         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  Hip surgery 6 months ago, no longer considered a contributing event due to guidelines. No severe symptoms such as shortness of breath or chest pain present at this time, which is reassuring. Exam benign with exception of erythema noted on the left medial calf. Differential includes potential clotting disorder, May-Thurner Syndrome,  but no immediate signs of malignancy or other systemic issues present at this time. It is unclear if clotting disorder has been evaluated. Eliquis  prescribed for anticoagulation by ED. Patient has questions about duration of therapy today. Given that this is a second event, typically long term management is recommended, but he does not wish to stay on this treatment for an extended period, if possible. I will review the history and previous labs to determine if additional testing is required. Also consider underlying venous vulnerability in the limb. Will consider referral to DVT clinic for complete evaluation and recommendations.  - Continue Eliquis , reassess duration based on further evaluation. Considering 2nd episode of DVT, this is considered unprovoked and typically ongoing anticoagulation would be  recommended.  - Ordered kidney function tests and platelet count for monitoring - Consider consult with DVT clinic for further evaluation and recommendations to reduce the risk of recurrence.  - Testing to consider: APS Panel, Factor V Leiden, Prothrombin gene mutation, CT venography of the pelvis for iliac vein narrowing or collateral changes (MTS). - Advised wearing compression stockings during the day and elevating legs when sitting. - Recommended icing and warm compresses for symptomatic relief. No massage - Avoiding sitting for long periods. Recommend continue with daily activity.  - Instructed to monitor for signs of pulmonary embolism, such as sudden shortness of breath, dizziness, or hemoptysis, and seek emergency care if these occur. - Advised against driving if experiencing severe symptoms. - Provided information on Eliquis  and signs of medical  emergencies.  Orders:   CBC with Differential/Platelet   Comprehensive metabolic panel with GFR      SaraBeth Tiney Zipper, DNP, AGNP-c  {SETIMEYorN (Optional):34216} "

## 2024-07-13 NOTE — Patient Instructions (Signed)
 Blood Clots in Your Left Leg (DVT and Superficial Thrombophlebitis)  Your ultrasound showed blood clots in your left leg.  Deep Vein Thrombosis (DVT) is a clot in deep veins behind the knee (popliteal vein) and in the calf.  These veins carry blood back to your heart. This type of clot must be treated.  Superficial Thrombophlebitis is a clot in a surface vein called the great saphenous vein in the calf. This vein is closer to the skin. It can be painful and swollen. Because it is close to deep veins, it is taken seriously.  You may notice leg pain or soreness, swelling, and warmth or redness in the leg. These symptoms usually get better slowly over weeks.   If a deep clot is not treated it can get bigger or travel to the lungs, which is very dangerous.  Treatment helps stop the clot from growing, prevent new clots, and lower the chance of serious problems.  Your treatment is with Apixaban  (Eliquis ). This is a blood thinner. It does not break the clot right away, but helps your body slowly heal the clot safely. The clot will slowly dissolve over time with the medication.   It is very important that you take the medication exactly as prescribed. Take it every day, at the same times,  do not skip doses, and do not stop the medicine unless told to by a doctor.  Most people take this medicine for at least 3 months.  While on apixaban  it is very important that you tell all doctors and dentists you are on a blood thinner. You must also avoid medicines like ibuprofen or naproxen unless approved by one of your doctors.  Use care to avoid cuts or falls, this medication can make you bleed and bruise easier.   Call for medical help immediately or go to the emergency room if you have heavy bleeding, nosebleeds that wont stop, black or bloody stools, blood in urine, or large or painful bruises.    What you SHOULD do  ? Walk gently every day ? Elevate your leg when resting ? Drink plenty of  fluids ? Wear compression stockings if recommended ? Take apixaban  exactly as prescribed  What you should NOT do  ?? Do not massage the leg ?? Do not sit or lie down for long periods ?? Do not stop apixaban  on your own  ?? Get emergency help right away if you have sudden shortness of breath, chest pain (especially when breathing), coughing up blood, fast heartbeat, or feeling faint or passing out. These can be signs of a blood clot in the lungs.

## 2024-07-14 ENCOUNTER — Ambulatory Visit: Payer: Self-pay | Admitting: Nurse Practitioner

## 2024-07-14 ENCOUNTER — Encounter: Payer: Self-pay | Admitting: Nurse Practitioner

## 2024-07-14 DIAGNOSIS — D509 Iron deficiency anemia, unspecified: Secondary | ICD-10-CM

## 2024-07-14 LAB — CBC WITH DIFFERENTIAL/PLATELET
Basophils Absolute: 0.1 x10E3/uL (ref 0.0–0.2)
Basos: 1 %
EOS (ABSOLUTE): 0.1 x10E3/uL (ref 0.0–0.4)
Eos: 1 %
Hematocrit: 34 % — ABNORMAL LOW (ref 37.5–51.0)
Hemoglobin: 11.3 g/dL — ABNORMAL LOW (ref 13.0–17.7)
Immature Grans (Abs): 0 x10E3/uL (ref 0.0–0.1)
Immature Granulocytes: 0 %
Lymphocytes Absolute: 1.2 x10E3/uL (ref 0.7–3.1)
Lymphs: 19 %
MCH: 28.7 pg (ref 26.6–33.0)
MCHC: 33.2 g/dL (ref 31.5–35.7)
MCV: 86 fL (ref 79–97)
Monocytes Absolute: 0.5 x10E3/uL (ref 0.1–0.9)
Monocytes: 9 %
Neutrophils Absolute: 4.2 x10E3/uL (ref 1.4–7.0)
Neutrophils: 70 %
Platelets: 275 x10E3/uL (ref 150–450)
RBC: 3.94 x10E6/uL — ABNORMAL LOW (ref 4.14–5.80)
RDW: 13.9 % (ref 11.6–15.4)
WBC: 6.1 x10E3/uL (ref 3.4–10.8)

## 2024-07-14 LAB — COMPREHENSIVE METABOLIC PANEL WITH GFR
ALT: 17 IU/L (ref 0–44)
AST: 24 IU/L (ref 0–40)
Albumin: 4.4 g/dL (ref 3.9–4.9)
Alkaline Phosphatase: 83 IU/L (ref 47–123)
BUN/Creatinine Ratio: 18 (ref 10–24)
BUN: 15 mg/dL (ref 8–27)
Bilirubin Total: 0.3 mg/dL (ref 0.0–1.2)
CO2: 23 mmol/L (ref 20–29)
Calcium: 9.4 mg/dL (ref 8.6–10.2)
Chloride: 96 mmol/L (ref 96–106)
Creatinine, Ser: 0.82 mg/dL (ref 0.76–1.27)
Globulin, Total: 2.3 g/dL (ref 1.5–4.5)
Glucose: 109 mg/dL — AB (ref 70–99)
Potassium: 4.3 mmol/L (ref 3.5–5.2)
Sodium: 131 mmol/L — AB (ref 134–144)
Total Protein: 6.7 g/dL (ref 6.0–8.5)
eGFR: 99 mL/min/1.73

## 2024-07-21 LAB — IRON AND TIBC
Iron Saturation: 9 % — CL (ref 15–55)
Iron: 39 ug/dL (ref 38–169)
Total Iron Binding Capacity: 454 ug/dL — ABNORMAL HIGH (ref 250–450)
UIBC: 415 ug/dL — ABNORMAL HIGH (ref 111–343)

## 2024-07-21 LAB — FERRITIN: Ferritin: 19 ng/mL — ABNORMAL LOW (ref 30–400)

## 2024-07-21 LAB — SPECIMEN STATUS REPORT

## 2024-07-21 LAB — OSMOLALITY: Osmolality Meas: 313 mosm/kg — AB (ref 280–301)

## 2024-07-23 NOTE — Progress Notes (Unsigned)
 " DVT Clinic Note  Name: Alfred Delgado     MRN: [id number]     DOB: 1960-08-03     Sex: male  PCP: Oris Camie BRAVO, NP  Today's Visit: Visit Information: Initial Visit  Referred to DVT Clinic by: Primary Care - Early, Camie BRAVO, NP Referred to CPP by: Dr. Serene Reason for referral:  Chief Complaint  Patient presents with   Med Management - DVT   DVT   HISTORY OF PRESENT ILLNESS: Alfred Delgado is a 64 y.o. male who presents after diagnosis of DVT for medication management. Patient reported to the ED on 07/11/24 with left calf pain and swelling. Vascular ultrasound was ordered and completed 07/12/24 with evidence of DVT in the left popliteal vein and calf veins and superficial thrombophlebitis of the left great saphenous vein in the calf as well as communicating varicosities. Patient was started on Eliquis  5 mg twice daily. Patient followed up with PCP on 07/14/23 who referred patient to DVT clinic. Patient has a history of acute DVT in 2012 after an esophagectomy at which time patient was treated with Lovenox for about 4 months. Patient is ambulating today without assistance. Patient reports that his leg was in pain, swollen, red, and lumpy for about a week prior to diagnosis. He recently traveled to [place]  about 5 hours away, but he was experiencing leg pain prior to this trip at which time he started wearing compression stockings. Endorses living an active lifestyle where work keeps him busy and walks his dogs daily. Otherwise, denies any recent surgery or acute illness. Patient was started on Eliquis  and has been tolerating well. Previous symptoms have since resolved. Denies any abnormal bruising or bleeding. Of note, patient reports paying ~$320 for a one month supply of Eliquis .   Positive Thrombotic Risk Factors: Previous VTE Bleeding Risk Factors: None Present  Negative Thrombotic Risk Factors: Recent surgery (within 3 months), Recent trauma (within 3 months), Recent admission to hospital with  acute illness (within 3 months), Paralysis, paresis, or recent plaster cast immobilization of lower extremity, Central venous catheterization, Bed rest >72 hours within 3 months, Sedentary journey lasting >8 hours within 4 weeks, Pregnancy, Within 6 weeks postpartum, Recent cesarean section (within 3 months), Estrogen therapy, Testosterone therapy, Erythropoiesis-stimulating agent, Recent COVID diagnosis (within 3 months), Active cancer, Non-malignant, chronic inflammatory condition, Known thrombophilic condition, Smoking, Older age, Obesity  Rx Insurance Coverage: Commercial Rx Affordability: Eliquis : $332.58 for 30 days, $622.64 for 90 days. Provided with copay card to reduce cost to $10/month. Rx Assistance Provided: Co-pay card Preferred Pharmacy: Refills sent to [facility] [employer]  Past Medical History:  Diagnosis Date   Allergy    Anemia    Arthritis    Asthma    BELCHING 07/23/2010   Qualifier: Diagnosis of  By: Inocencio MD, Berwyn LABOR    CHEST PAIN 06/25/2010   Qualifier: Diagnosis of  By: Inocencio MD, Berwyn A    DVT of lower extremity (deep venous thrombosis) (HCC)    History of colon polyps    History of colonic diverticulitis    LUQ PAIN 06/25/2010   Qualifier: Diagnosis of  By: Inocencio MD, Berwyn A    Multiple lipomas    History of   NEOPLASM, MALIGNANT, ESOPHAGUS 08/30/2010   Pre-diabetes     Past Surgical History:  Procedure Laterality Date   COLONOSCOPY  last 2013   ESOPHAGOGASTRECTOMY  2012   Dr. Dickey [place]   POLYPECTOMY     PORTA CATH REMOVAL  PORTACATH PLACEMENT  03/14/2011   TONSILLECTOMY     TOTAL HIP ARTHROPLASTY Right 01/02/2024   Procedure: ARTHROPLASTY, HIP, TOTAL, ANTERIOR APPROACH;  Surgeon: Vernetta Lonni GRADE, MD;  Location: [facility];  Service: Orthopedics;  Laterality: Right;   UPPER GASTROINTESTINAL ENDOSCOPY      Social History   Socioeconomic History   Marital status: Married    Spouse name: Not on file   Number of children: 0   Years of  education: 12   Highest education level: Not on file  Occupational History   Occupation: Barrister's Clerk   Tobacco Use   Smoking status: Former    Current packs/day: 0.00    Average packs/day: 1.5 packs/day for 20.0 years (30.0 ttl pk-yrs)    Types: Cigarettes    Start date: 07/08/1977    Quit date: 07/08/1997    Years since quitting: 27.0   Smokeless tobacco: Never  Vaping Use   Vaping status: Never Used  Substance and Sexual Activity   Alcohol use: No    Alcohol/week: 0.0 standard drinks of alcohol   Drug use: No   Sexual activity: Not on file  Other Topics Concern   Not on file  Social History Narrative   Fun: Motorcycle, walk, church, pottery, bass guitar   Denies religious beliefs effecting health care.    Social Drivers of Health   Tobacco Use: Medium Risk (07/26/2024)   Patient History    Smoking Tobacco Use: Former    Smokeless Tobacco Use: Never    Passive Exposure: Not on file  Financial Resource Strain: Not on file  Food Insecurity: Patient Declined (01/02/2024)   Epic    Worried About Programme Researcher, Broadcasting/film/video in the Last Year: Patient declined    Barista in the Last Year: Patient declined  Transportation Needs: Patient Declined (01/02/2024)   Epic    Lack of Transportation (Medical): Patient declined    Lack of Transportation (Non-Medical): Patient declined  Physical Activity: Not on file  Stress: Not on file  Social Connections: Not on file  Intimate Partner Violence: Patient Declined (01/02/2024)   Epic    Fear of Current or Ex-Partner: Patient declined    Emotionally Abused: Patient declined    Physically Abused: Patient declined    Sexually Abused: Patient declined  Depression (PHQ2-9): Low Risk (12/23/2022)   Depression (PHQ2-9)    PHQ-2 Score: 0  Alcohol Screen: Not on file  Housing: Unknown (01/02/2024)   Epic    Unable to Pay for Housing in the Last Year: Patient declined    Number of Times Moved in the Last Year: 0    Homeless in the Last  Year: Patient declined  Utilities: Patient Declined (01/02/2024)   Epic    Threatened with loss of utilities: Patient declined  Health Literacy: Not on file    Family History  Problem Relation Age of Onset   Breast cancer Mother    Thyroid  disease Mother    Throat cancer Father    Colon polyps Father    Esophageal cancer Father    Thyroid  disease Maternal Grandmother    Cancer Paternal Grandmother    Heart disease Paternal Grandfather    Colon cancer Neg Hx    Rectal cancer Neg Hx    Stomach cancer Neg Hx     Allergies as of 07/26/2024   (No Known Allergies)    Medications Ordered Prior to Encounter[1] REVIEW OF SYSTEMS:  Review of Systems  Respiratory:  Negative for shortness of  breath.   Cardiovascular:  Positive for leg swelling. Negative for chest pain.   PHYSICAL EXAMINATION:  Physical Exam Musculoskeletal:        General: Swelling present. No tenderness.     Left lower leg: No edema.   Villalta Score for Post-Thrombotic Syndrome: Pain: Mild Cramps: Absent Heaviness: Absent Paresthesia: Absent Pruritus: Absent Pretibial Edema: Absent Skin Induration: Absent Hyperpigmentation: Absent Redness: Mild Venous Ectasia: Absent Pain on calf compression: Absent Villalta Preliminary Score: 2 Is venous ulcer present?: No If venous ulcer is present and score is <15, then 15 points total are assigned: Absent Villalta Total Score: 2  LABS:  CBC     Component Value Date/Time   WBC 6.1 07/13/2024 1650   WBC 8.0 01/03/2024 0353   RBC 3.94 (L) 07/13/2024 1650   RBC 3.66 (L) 01/03/2024 0353   HGB 11.3 (L) 07/13/2024 1650   HGB 11.6 (L) 04/01/2013 1548   HCT 34.0 (L) 07/13/2024 1650   HCT 33.9 (L) 04/01/2013 1548   PLT 275 07/13/2024 1650   MCV 86 07/13/2024 1650   MCV 92.7 04/01/2013 1548   MCH 28.7 07/13/2024 1650   MCH 30.1 01/03/2024 0353   MCHC 33.2 07/13/2024 1650   MCHC 32.7 01/03/2024 0353   RDW 13.9 07/13/2024 1650   RDW 13.2 04/01/2013 1548    LYMPHSABS 1.2 07/13/2024 1650   LYMPHSABS 0.7 (L) 04/01/2013 1548   MONOABS 0.4 04/01/2013 1548   EOSABS 0.1 07/13/2024 1650   BASOSABS 0.1 07/13/2024 1650   BASOSABS 0.0 04/01/2013 1548    Hepatic Function      Component Value Date/Time   PROT 6.7 07/13/2024 1650   ALBUMIN 4.4 07/13/2024 1650   AST 24 07/13/2024 1650   ALT 17 07/13/2024 1650   ALKPHOS 83 07/13/2024 1650   BILITOT 0.3 07/13/2024 1650   BILIDIR 0.1 06/25/2010 1011    Renal Function   Lab Results  Component Value Date   CREATININE 0.82 07/13/2024   CREATININE 0.57 (L) 01/03/2024   CREATININE 0.84 12/22/2023    CrCl cannot be calculated (Unknown ideal weight.).   [facility] Vascular Lab Studies:  07/11/24 LEFT LOWER EXTREMITY VENOUS DOPPLER ULTRASOUND   FINDINGS: Contralateral Common Femoral Vein: Respiratory phasicity is normal and symmetric with the symptomatic side. No evidence of thrombus. Normal compressibility.   Common Femoral Vein: No evidence of thrombus. Normal compressibility, respiratory phasicity and response to augmentation.   Saphenofemoral Junction: No evidence of thrombus. Normal compressibility and flow on color Doppler imaging.   Profunda Femoral Vein: No evidence of thrombus. Normal compressibility and flow on color Doppler imaging.   Femoral Vein: No evidence of thrombus. Normal compressibility, respiratory phasicity and response to augmentation.   Popliteal Vein: Thrombus noted throughout the popliteal vein with some central low remaining.   Calf Veins: Thrombus identified in the visualized posterior tibial and peroneal veins.   Superficial Great Saphenous Vein: Superficial thrombophlebitis of the left great saphenous vein in the calf as well as communicating varicosities.   Venous Reflux:  None.   Other Findings:  No abnormal fluid collections.   IMPRESSION: 1. Positive for deep vein thrombosis in the left popliteal vein and calf veins. 2. Superficial thrombophlebitis of the left great  saphenous vein in the calf as well as communicating varicosities.  ASSESSMENT: Location of DVT: Left popliteal vein, Left superficial vein, Left distal vein Cause of DVT: unprovoked  Patient with prior history of provoked DVT in 2012, now with recent evidence of acute DVT involving the left popliteal  vein and calf veins and superficial thrombophlebitis in the left great saphenous vein as well as communicating varicosities diagnosed on 07/11/24. DVT appears to be unprovoked with no identifiable cause and with this being the patient's second DVT, will refer patient to hematology for further workup and management of anticoagulation treatment duration. Discussed with patient that May-Thurner Syndrome, which patient's PCP requested we rule out, is not likely the cause as DVT is only as proximal as the popliteal vein. Would expect DVT to extend to iliac veins with May-Thurner. Discussed treatment duration of at least 6 months for full dose anticoagulation and then likely needs lifelong anticoagulation. Notified patient that hematology would further decide and confirm treatment duration and dosing beyond that after further workup. Extensively counseled on Eliquis . Provided patient Eliquis  copay card and instructed patient on how to activate card to reduce cost from $332/month to $10/month. Provided refills to patient's preferred pharmacy. All patient's questions were answered. Patient confirmed understanding of plan.  PLAN: -Continue apixaban  (Eliquis ) 5 mg twice daily. -Expected duration of therapy: per hematology. Therapy started on 07/12/24. -Patient educated on purpose, proper use and potential adverse effects of apixaban  (Eliquis ). -Discussed importance of taking medication around the same time every day. -Advised patient of medications to avoid (NSAIDs, aspirin  doses >100 mg daily). -Educated that Tylenol  (acetaminophen ) is the preferred analgesic to lower the risk of bleeding. -Advised patient to alert  all providers of anticoagulation therapy prior to starting a new medication or having a procedure. -Emphasized importance of monitoring for signs and symptoms of bleeding (abnormal bruising, prolonged bleeding, nose bleeds, bleeding from gums, discolored urine, black tarry stools). -Educated patient to present to the ED if emergent signs and symptoms of new thrombosis occur. -Counseled patient to wear compression stockings daily, removing at night.  Follow up: with hematology  Jenkins Graces, PharmD PGY1 Pharmacy Resident  Lum Herald, PharmD, BCACP, CPP [facility] [phone]   I have evaluated the patient's chart/imaging and refer this patient to the Clinical Pharmacist Practitioner for medication management. I have reviewed the CPP's documentation and agree with her assessment and plan. I was immediately available during the visit for questions and collaboration.   Malvina New, MD     [1]  Current Outpatient Medications on File Prior to Visit  Medication Sig Dispense Refill   albuterol  (VENTOLIN  HFA) 108 (90 Base) MCG/ACT inhaler Inhale 2 puffs into the lungs every 6 (six) hours as needed for wheezing or shortness of breath. 8 g 0   No current facility-administered medications on file prior to visit.   "

## 2024-07-26 ENCOUNTER — Ambulatory Visit: Admitting: Student-PharmD

## 2024-07-26 ENCOUNTER — Other Ambulatory Visit (HOSPITAL_COMMUNITY): Payer: Self-pay

## 2024-07-26 ENCOUNTER — Encounter: Payer: Self-pay | Admitting: Student-PharmD

## 2024-07-26 DIAGNOSIS — I82432 Acute embolism and thrombosis of left popliteal vein: Secondary | ICD-10-CM

## 2024-07-26 MED ORDER — APIXABAN 5 MG PO TABS: 5.0000 mg | ORAL_TABLET | Freq: Two times a day (BID) | ORAL | 5 refills | Status: AC

## 2024-07-26 NOTE — Patient Instructions (Signed)
-  Continue apixaban  (Eliquis ) 5 mg twice daily.  -Your refills have been sent to [facility] in [place]. You may need to call the pharmacy to ask them to fill this when you start to run low on your current supply.  -Referral placed for hematology at [facility].  -It is important to take your medication around the same time every day.  -Avoid NSAIDs like ibuprofen (Advil, Motrin) and naproxen (Aleve) as well as aspirin  doses over 100 mg daily. -Tylenol  (acetaminophen ) is the preferred over the counter pain medication to lower the risk of bleeding. -Be sure to alert all of your health care providers that you are taking an anticoagulant prior to starting a new medication or having a procedure. -Monitor for signs and symptoms of bleeding (abnormal bruising, prolonged bleeding, nose bleeds, bleeding from gums, discolored urine, black tarry stools). If you have fallen and hit your head OR if your bleeding is severe or not stopping, seek emergency care.  -Go to the emergency room if emergent signs and symptoms of new clot occur (new or worse swelling and pain in an arm or leg, shortness of breath, chest pain, fast or irregular heartbeats, lightheadedness, dizziness, fainting, coughing up blood) or if you experience a significant color change (pale or blue) in the extremity that has the DVT.  -We recommend you wear compression stockings (20-30 mmHg) as long as you are having swelling or pain. Be sure to purchase the correct size and take them off at night.   If you have any questions or need to reschedule an appointment, please call [phone]. If you are having an emergency, call 911 or present to the nearest emergency room.   What is a DVT?  -Deep vein thrombosis (DVT) is a condition in which a blood clot forms in a vein of the deep venous system which can occur in the lower leg, thigh, pelvis, arm, or neck. This condition is serious and can be life-threatening if the clot travels to the arteries of  the lungs and causing a blockage (pulmonary embolism, PE). A DVT can also damage veins in the leg, which can lead to long-term venous disease, leg pain, swelling, discoloration, and ulcers or sores (post-thrombotic syndrome).  -Treatment may include taking an anticoagulant medication to prevent more clots from forming and the current clot from growing, wearing compression stockings, and/or surgical procedures to remove or dissolve the clot.

## 2024-08-02 ENCOUNTER — Encounter: Payer: Self-pay | Admitting: Nurse Practitioner

## 2024-08-16 ENCOUNTER — Ambulatory Visit: Admitting: Orthopaedic Surgery

## 2024-08-23 ENCOUNTER — Inpatient Hospital Stay: Admitting: Oncology

## 2024-08-23 ENCOUNTER — Inpatient Hospital Stay

## 2024-09-13 ENCOUNTER — Encounter: Admitting: Nurse Practitioner

## 2024-10-18 ENCOUNTER — Other Ambulatory Visit
